# Patient Record
Sex: Female | Born: 1935 | Race: White | Hispanic: No | Marital: Married | Smoking: Never smoker
Health system: Southern US, Community
[De-identification: ages and names within clinical notes are randomized; demographics above are authoritative.]

## PROBLEM LIST (undated history)

## (undated) DIAGNOSIS — K529 Noninfective gastroenteritis and colitis, unspecified: Secondary | ICD-10-CM

## (undated) DIAGNOSIS — E119 Type 2 diabetes mellitus without complications: Secondary | ICD-10-CM

## (undated) DIAGNOSIS — K589 Irritable bowel syndrome without diarrhea: Secondary | ICD-10-CM

## (undated) DIAGNOSIS — E7800 Pure hypercholesterolemia, unspecified: Secondary | ICD-10-CM

## (undated) DIAGNOSIS — I10 Essential (primary) hypertension: Secondary | ICD-10-CM

## (undated) DIAGNOSIS — M109 Gout, unspecified: Secondary | ICD-10-CM

## (undated) DIAGNOSIS — M1990 Unspecified osteoarthritis, unspecified site: Secondary | ICD-10-CM

## (undated) HISTORY — PX: OTHER SURGICAL HISTORY: SHX169

## (undated) HISTORY — DX: Gout, unspecified: M10.9

## (undated) HISTORY — PX: ABDOMINAL HYSTERECTOMY: SHX81

## (undated) HISTORY — DX: Unspecified osteoarthritis, unspecified site: M19.90

## (undated) HISTORY — PX: APPENDECTOMY: SHX54

## (undated) HISTORY — PX: BRAIN SURGERY: SHX531

## (undated) HISTORY — PX: CHOLECYSTECTOMY: SHX55

## (undated) HISTORY — PX: BREAST BIOPSY: SHX20

---

## 1998-05-19 HISTORY — PX: REPLACEMENT TOTAL KNEE: SUR1224

## 2004-04-24 ENCOUNTER — Ambulatory Visit: Payer: Self-pay | Admitting: Unknown Physician Specialty

## 2004-06-03 ENCOUNTER — Ambulatory Visit: Payer: Self-pay | Admitting: Internal Medicine

## 2005-02-06 ENCOUNTER — Ambulatory Visit: Payer: Self-pay | Admitting: Internal Medicine

## 2005-03-14 ENCOUNTER — Ambulatory Visit: Payer: Self-pay | Admitting: Internal Medicine

## 2006-01-01 ENCOUNTER — Ambulatory Visit: Payer: Self-pay

## 2006-04-14 ENCOUNTER — Ambulatory Visit: Payer: Self-pay | Admitting: Internal Medicine

## 2006-06-11 ENCOUNTER — Ambulatory Visit: Payer: Self-pay | Admitting: Cardiology

## 2007-06-16 ENCOUNTER — Ambulatory Visit: Payer: Self-pay | Admitting: Internal Medicine

## 2007-06-17 ENCOUNTER — Ambulatory Visit: Payer: Self-pay | Admitting: Internal Medicine

## 2008-01-22 ENCOUNTER — Emergency Department: Payer: Self-pay

## 2008-06-08 ENCOUNTER — Ambulatory Visit: Payer: Self-pay | Admitting: Ophthalmology

## 2008-06-20 ENCOUNTER — Ambulatory Visit: Payer: Self-pay | Admitting: Ophthalmology

## 2008-07-13 ENCOUNTER — Ambulatory Visit: Payer: Self-pay | Admitting: Internal Medicine

## 2008-07-19 ENCOUNTER — Ambulatory Visit: Payer: Self-pay | Admitting: Ophthalmology

## 2008-08-01 ENCOUNTER — Ambulatory Visit: Payer: Self-pay | Admitting: Ophthalmology

## 2008-08-28 ENCOUNTER — Ambulatory Visit: Payer: Self-pay | Admitting: Unknown Physician Specialty

## 2008-09-12 ENCOUNTER — Ambulatory Visit: Payer: Self-pay | Admitting: Unknown Physician Specialty

## 2009-03-01 ENCOUNTER — Ambulatory Visit: Payer: Self-pay | Admitting: Unknown Physician Specialty

## 2009-07-25 ENCOUNTER — Ambulatory Visit: Payer: Self-pay | Admitting: Internal Medicine

## 2010-07-02 ENCOUNTER — Ambulatory Visit: Payer: Self-pay | Admitting: Ophthalmology

## 2010-07-29 ENCOUNTER — Ambulatory Visit: Payer: Self-pay | Admitting: Internal Medicine

## 2011-06-13 ENCOUNTER — Ambulatory Visit: Payer: Self-pay | Admitting: Unknown Physician Specialty

## 2011-07-07 ENCOUNTER — Ambulatory Visit: Payer: Self-pay | Admitting: Internal Medicine

## 2011-07-24 ENCOUNTER — Ambulatory Visit: Payer: Self-pay | Admitting: Unknown Physician Specialty

## 2011-07-24 LAB — URINALYSIS, COMPLETE
Bacteria: NONE SEEN
Blood: NEGATIVE
Glucose,UR: 150 mg/dL (ref 0–75)
Nitrite: NEGATIVE
Ph: 5 (ref 4.5–8.0)
Protein: NEGATIVE
Specific Gravity: 1.021 (ref 1.003–1.030)
Transitional Epi: <1
WBC UR: 2 /HPF (ref 0–5)

## 2011-07-24 LAB — PROTIME-INR: INR: 1

## 2011-07-24 LAB — APTT: Activated PTT: 29 secs (ref 23.6–35.9)

## 2011-07-30 ENCOUNTER — Inpatient Hospital Stay: Payer: Self-pay | Admitting: Unknown Physician Specialty

## 2011-07-30 LAB — HEMOGLOBIN: HGB: 12.1 g/dL (ref 12.0–16.0)

## 2011-07-30 LAB — CREATININE, SERUM
Creatinine: 0.77 mg/dL (ref 0.60–1.30)
EGFR (African American): >60
EGFR (Non-African Amer.): >60

## 2011-07-30 LAB — PLATELET COUNT: Platelet: 198 10*3/uL (ref 150–440)

## 2011-07-30 LAB — ELECTROLYTE PANEL: Chloride: 106 mmol/L (ref 98–107)

## 2011-07-31 LAB — BASIC METABOLIC PANEL
Calcium, Total: 7.9 mg/dL — ABNORMAL LOW (ref 8.5–10.1)
Chloride: 102 mmol/L (ref 98–107)
EGFR (Non-African Amer.): >60
Glucose: 214 mg/dL — ABNORMAL HIGH (ref 65–99)
Potassium: 3.4 mmol/L — ABNORMAL LOW (ref 3.5–5.1)
Sodium: 138 mmol/L (ref 136–145)

## 2011-08-02 LAB — CBC WITH DIFFERENTIAL/PLATELET
Basophil %: 0.3 %
Eosinophil %: 0.6 %
HCT: 27.7 % — ABNORMAL LOW (ref 35.0–47.0)
Lymphocyte #: 1.5 10*3/uL (ref 1.0–3.6)
MCH: 30 pg (ref 26.0–34.0)
MCV: 90 fL (ref 80–100)
Monocyte #: 0.7 10*3/uL (ref 0.0–0.7)
Monocyte %: 9.4 %
Neutrophil #: 5.5 10*3/uL (ref 1.4–6.5)
Neutrophil %: 70.5 %
Platelet: 192 10*3/uL (ref 150–440)
RBC: 3.08 10*6/uL — ABNORMAL LOW (ref 3.80–5.20)
RDW: 13.9 % (ref 11.5–14.5)

## 2011-11-18 ENCOUNTER — Ambulatory Visit: Payer: Self-pay | Admitting: Internal Medicine

## 2012-11-30 ENCOUNTER — Ambulatory Visit: Payer: Self-pay | Admitting: Internal Medicine

## 2013-01-31 ENCOUNTER — Ambulatory Visit: Payer: Self-pay | Admitting: Cardiology

## 2013-06-30 ENCOUNTER — Ambulatory Visit: Payer: Self-pay | Admitting: Specialist

## 2013-09-06 ENCOUNTER — Ambulatory Visit: Payer: Self-pay | Admitting: Ophthalmology

## 2013-12-26 ENCOUNTER — Ambulatory Visit: Payer: Self-pay | Admitting: Internal Medicine

## 2014-08-02 ENCOUNTER — Ambulatory Visit: Payer: Self-pay | Admitting: Unknown Physician Specialty

## 2014-09-10 NOTE — Op Note (Signed)
PATIENT NAME:  Dana Green, Dana Green MR#:  [id number] DATE OF BIRTH:  [birth year]  DATE OF PROCEDURE:  07/30/2011  PREOPERATIVE DIAGNOSIS:  Symptomatic degenerative arthritis, left knee.   POSTOPERATIVE DIAGNOSIS:   Symptomatic degenerative arthritis, left knee.   OPERATION: [employer] total knee replacement on the left.   SURGEON: Kathrene Alu., M.D.   FIRST ASSISTANT: Dorthula Matas, PA-C   ANESTHESIA: Femoral nerve block plus spinal.   HISTORY: The patient had a long history of bilateral knee pain. Her left had been more symptomatic. X-rays were consistent with rather significant degenerative arthritis of her knees. Because she had not responded to conservative treatment, she was ultimately brought in for a total knee replacement on the left.   DESCRIPTION OF PROCEDURE: The patient was taken to operating room where satisfactory femoral nerve block was achieved on the left followed by satisfactory spinal anesthesia.   A tourniquet was applied to patient's left upper thigh and the left lower extremity was prepped and draped in the usual fashion for a procedure about the knee. The patient incidentally was given 2 grams of Kefzol IV prior to the start of the procedure.   Next, the left lower extremity was exsanguinated and the tourniquet was inflated. A longitudinal incision was made over the anterior aspect of the left knee joint. Dissection was carried down through the subcutaneous tissue onto the extensor mechanism. The vastus medialis was divided in line with its fibers about a fingerbreadth above the superomedial pole of the patella. The joint was entered and dissection was carried along the medial border of the patella and medial border of the patellar tendon. As the knee was flexed, the patella was everted and dislocated laterally. Inspection of the knee joint revealed that the patient had some osteophytes about the distal femur. There was a significant central chondral lesions in her  medial and lateral femoral condyles. There was a significant degenerative indentation of the midportion of her lateral tibial plateau.   I went ahead and removed the osteophytes. Meniscal remnants were excised.   A 3/8-inch hole was made in the trochlear groove about a centimeter anterior to the posterior cruciate ligament insertion on the distal femur.  An intramedullary rod was inserted.  On the distal end of the rod was an alignment jig and cutting block. Once the alignment jig was appropriately positioned, the cutting block was fixed to the distal femur with smooth pins. Alignment jig and intramedullary rod were removed. The distal femoral cut was made. I then templated the distal femur for a #7 femoral component.  We had to make the starting holes with a #9 jig in order to keep from notching the anterior aspect of the distal femur. The starting holes were placed in slight external rotation. I then impacted the #7 cutting block onto the distal femur and made my anterior and posterior cuts along with my anterior and posterior chamfer cuts. The intercondylar area was notched out using the appropriate jig. The anterior and posterior cruciates were removed at this time.   With the knee hyperflexed, I went ahead and made a 3/8-inch hole in the midline of the proximal tibia at the junction of the anterior one third and posterior two thirds. Intramedullary rod was inserted. On the proximal end of the rod was a 0-degree sloped cutting block. We positioned the cutting block for a cut 2 mm below the lowest portion of the lateral tibial plateau. The cutting block was aligned and then fixed to the proximal tibia  with smooth pins. The intramedullary rod and stylus were removed, and then I made my proximal tibia cut without difficulty. We sized the proximal tibia for a #5 tibial baseplate.   Osteophytes were removed from the posterior aspect of the medial and lateral femoral condyles along with some residual meniscal  remnants. I used an elevator to try to free up the capsule from its attachment to the posterior aspect of the distal femur.   I checked the medial and lateral gaps with the knee extended. They seemed to be symmetric.   I went ahead and inserted the trial components. This was a #5 tibial baseplate and a #7 PS femoral component. The 10-mm polyethylene PS spacer was used. It seemed to be too thick.  I removed it and inserted an 8-mm spacer onto the tibial baseplate. This allowed for virtual full extension. The knee seemed to track well and was quite stable.   I released the tourniquet. It had been up about 79 minutes initially. I then removed about 8 mm of bone from the retropatellar surface. Three holes were made for a #5 trial retropatellar resurfacing component. With the #5 10-mm thick retropatellar resurfacing component in place the patella seemed to track well.   After the tourniquet had been down about 10 minutes the left lower extremity was re- exsanguinated and the tourniquet was inflated.   I then made the cruciate cut in the proximal tibia using the #5 tibial baseplate as a guide. Next, I used jet lavage to remove blood from the cut cancellous surface. I then sequentially implanted the total knee components. The #5 tibial baseplate was impacted onto the proximal tibia with methylmethacrylate. The #7 PS femoral component was impacted onto the distal femur with methylmethacrylate. Excess glue was removed. I then inserted an 8-mm trial polyethylene spacer. The knee was extended and then the #5 10-mm thick resurfacing patellar component was glued to the retropatellar surface.  Once again excess glue was removed.   After the glue had set up, I removed the trial 8-mm spacer and impacted the permanent #5   8-mm PS tibial-bearing insert onto the tibial baseplate.   Once again the knee moved well and seemed to be stable. Tourniquet was released. It was up about 34 minutes the second time. Bleeding was  controlled with coagulation cautery. The wound  was again irrigated with GU irrigant. Two Autovac tubes were inserted into the depths of the wound. The capsule was closed with #1 Ethibond sutures and #1 Vicryl sutures. The subcutaneous tissue was closed with 2-0 Vicryl and the skin with skin staples. Betadine was applied to the incision and the puncture wounds. Sterile dressing was applied followed by a Polar Care cooling pad and a knee immobilizer.   The patient was then transferred to her stretcher bed and taken to the recovery room in satisfactory condition. Blood loss was about 300 mL.   SUMMARY OF IMPLANTS USED: #7 PS Scorpio NRG femoral component, #5 standard tibial tray, #5 8-mm thick PS tibial-bearing insert, and a #5 10-mm thick resurfacing patellar component.   ____________________________ Kathrene Alu., MD hbk:bjt D: 07/31/2011 13:07:52 ET T: 07/31/2011 14:10:36 ET JOB#: [id number]  cc: Kathrene Alu., MD, <Dictator> Kathrene Alu MD ELECTRONICALLY SIGNED 08/10/2011 18:17

## 2014-09-10 NOTE — Discharge Summary (Signed)
PATIENT NAME:  Dana Green, Dana Green MR#:  [id number] DATE OF BIRTH:  [birth year]  DATE OF ADMISSION:  07/30/2011 DATE OF DISCHARGE:  08/03/2011  ADMITTING DIAGNOSIS: Status post left total knee replacement for degenerative arthritis.   DISCHARGE DIAGNOSES:  1. Status post left total knee replacement for degenerative arthritis.  2. Hypokalemia, improved.  3. Acute blood loss anemia.  4. Elevated blood sugars.   ATTENDING: Dr. Leanor Kail with [facility] orthopedics  PROCEDURE: On 03/13 the patient underwent left total knee arthroplasty by Dr. Jefm Bryant with assistant Dorthula Matas, PA-C.   ANESTHESIA: Spinal with femoral nerve block.   OPERATIVE FINDINGS: Tricompartmental degenerative arthritis.   IMPLANTS: By [employer].   DRAINS: Autovac drain was placed.   ESTIMATED BLOOD LOSS: 300 mL.   COMPLICATIONS: No complications had occurred.   PATIENT HISTORY: Dana Green is a 81 year old who has been medically cleared for a left knee replacement. She has had diffuse degenerative arthritis involving the left knee. She has had some right knee arthritis as well but the left knee has been more symptomatic.   ALLERGIES AND ADVERSE REACTIONS: Aspirin, propoxyphene, Fiorinal, Mevacor, Motrin, sulfa, Valium, hydrochlorothiazide and Cipro.   PAST MEDICAL HISTORY:  1. Allergies.  2. Hyperlipidemia.  3. Diabetes.  4. Gout.  5. Cataracts in both eyes. 6. Chickenpox.   PHYSICAL EXAMINATION: HEART: Normal sinus rhythm without murmur. LUNGS: Clear to auscultation. LEFT KNEE: Mild swelling. No obvious effusion. Mild valgus attitude. She had full extension with flexion to around 115 degrees. She was tender at the joint lines and McMurray's test produced diffuse discomfort. Her left knee was stable. No significant swelling in the left leg. No noted neurovascular deficit   HOSPITAL COURSE: Patient underwent aforementioned procedure on 07/30/2010 and was transferred to the postanesthesia care unit  then the orthopedic floor in stable condition. She did have some hypokalemia early on with 3.4 potassium. She would be supplemented with potassium and her potassium level would normalize on the 15th. Her hemoglobin level dropped to 9.4 on the 15th. No transfusion had been deemed necessary. Postoperative day two her Hemovac drain was pulled and dressing was changed. There was some problem with her knee draining a lot. Her Lovenox was temporarily held and then restarted. Her incision site was found to have staples intact and no sign of infection. The patient had a good hospital course. She tolerated her diet well. Again, her Foley catheter was removed on postoperative day two. She passed some stool on the 15th. She worked with physical therapy on multiple occasions while here and towards the end really did not have much pain. Active assistive range of motion of the knee was 2 to 89 degrees and patient was able to ambulate 225 feet last day she was here. Hemoglobin would drop to 9.3 on the 16th. Patient did have some elevated blood sugars as would be expected after surgery. She was discharged to her home with home health therapy on the 17th in stable condition.   DISCHARGE MEDICATIONS: Her discharge medications include the following: 1. Oxycodone 5 to 10 mg every four hours as needed for pain.  2. She may also take Tylenol.  3. Lovenox 40 mg subcutaneous injection once a day.  DISCHARGE INSTRUCTIONS AND FOLLOW UP: 1. She is weight-bearing as tolerated on the surgical leg.  2. She will wear thigh-high TED hose during the day.  3. No concentrated sweets or sugar in her diet.  4. Resume home medications with multivitamin.  5. She will keep her knee  dressing on, keep it clean and dry. It may be changed if needed though. She will keep her incision site clean and dry though. She will call our office for any disturbing symptoms.  6. She will use her Polar Care unit.  7. She will call our office to confirm  follow-up appointment.   ____________________________ Dana Green, [place] jrp:cms D: 08/06/2011 12:01:33 ET T: 08/07/2011 09:19:45 ET JOB#: [id number]  cc: Dana Ivory. Charlett Nose, [place], <Dictator> [place] PA ELECTRONICALLY SIGNED 08/07/2011 11:57

## 2014-09-11 LAB — SURGICAL PATHOLOGY

## 2014-10-08 ENCOUNTER — Encounter: Payer: Self-pay | Admitting: Emergency Medicine

## 2014-10-08 ENCOUNTER — Other Ambulatory Visit: Payer: Self-pay

## 2014-10-08 ENCOUNTER — Emergency Department: Payer: Medicare Other

## 2014-10-08 ENCOUNTER — Emergency Department
Admission: EM | Admit: 2014-10-08 | Discharge: 2014-10-08 | Disposition: A | Discharge status: Home / Self Care | Payer: Medicare Other | Attending: Emergency Medicine | Admitting: Emergency Medicine

## 2014-10-08 DIAGNOSIS — E119 Type 2 diabetes mellitus without complications: Secondary | ICD-10-CM | POA: Insufficient documentation

## 2014-10-08 DIAGNOSIS — I10 Essential (primary) hypertension: Secondary | ICD-10-CM | POA: Insufficient documentation

## 2014-10-08 DIAGNOSIS — R0789 Other chest pain: Secondary | ICD-10-CM

## 2014-10-08 DIAGNOSIS — R079 Chest pain, unspecified: Secondary | ICD-10-CM | POA: Diagnosis present

## 2014-10-08 HISTORY — DX: Essential (primary) hypertension: I10

## 2014-10-08 HISTORY — DX: Type 2 diabetes mellitus without complications: E11.9

## 2014-10-08 HISTORY — DX: Noninfective gastroenteritis and colitis, unspecified: K52.9

## 2014-10-08 HISTORY — DX: Pure hypercholesterolemia, unspecified: E78.00

## 2014-10-08 HISTORY — DX: Irritable bowel syndrome without diarrhea: K58.9

## 2014-10-08 LAB — CBC
HCT: 41.9 % (ref 35.0–47.0)
Hemoglobin: 13.6 g/dL (ref 12.0–16.0)
MCH: 28.5 pg (ref 26.0–34.0)
MCHC: 32.4 g/dL (ref 32.0–36.0)
MCV: 87.9 fL (ref 80.0–100.0)
Platelets: 189 10*3/uL (ref 150–440)
RBC: 4.77 MIL/uL (ref 3.80–5.20)
RDW: 14.7 % — ABNORMAL HIGH (ref 11.5–14.5)
WBC: 5.4 10*3/uL (ref 3.6–11.0)

## 2014-10-08 LAB — TROPONIN I: Troponin I: <0.03 ng/mL (ref <0.031)

## 2014-10-08 LAB — BASIC METABOLIC PANEL
Anion gap: 10 (ref 5–15)
BUN: 12 mg/dL (ref 6–20)
CO2: 22 mmol/L (ref 22–32)
CREATININE: 0.87 mg/dL (ref 0.44–1.00)
Calcium: 8.9 mg/dL (ref 8.9–10.3)
Chloride: 107 mmol/L (ref 101–111)
GFR, EST NON AFRICAN AMERICAN: 60 mL/min — AB (ref >60)
Glucose, Bld: 180 mg/dL — ABNORMAL HIGH (ref 65–99)
POTASSIUM: 4.3 mmol/L (ref 3.5–5.1)
Sodium: 139 mmol/L (ref 135–145)

## 2014-10-08 MED ORDER — RANITIDINE HCL 150 MG PO TABS
300.0000 mg | ORAL_TABLET | Freq: Once | ORAL | Status: AC
  Administered 2014-10-08: 300 mg via ORAL

## 2014-10-08 MED ORDER — GI COCKTAIL ~~LOC~~
ORAL | Status: AC
  Administered 2014-10-08: 30 mL via ORAL
  Filled 2014-10-08: qty 30

## 2014-10-08 MED ORDER — GI COCKTAIL ~~LOC~~
30.0000 mL | ORAL | Status: AC
  Administered 2014-10-08: 30 mL via ORAL

## 2014-10-08 MED ORDER — RANITIDINE HCL 150 MG PO TABS
ORAL_TABLET | ORAL | Status: AC
  Administered 2014-10-08: 300 mg via ORAL
  Filled 2014-10-08: qty 2

## 2014-10-08 MED ORDER — RANITIDINE HCL 150 MG PO CAPS: 150.0000 mg | ORAL_CAPSULE | Freq: Two times a day (BID) | ORAL | Status: DC

## 2014-10-08 NOTE — ED Notes (Signed)
Pt alert x4 respirations easy non labored ambulates without distress

## 2014-10-08 NOTE — ED Provider Notes (Signed)
[facility] Emergency Department Provider Note  ____________________________________________  Time seen: 8:15 AM  I have reviewed the triage vital signs and the nursing notes.   HISTORY  Chief Complaint Chest Pain    HPI Dana Green is a 81 y.o. female who complains of mid chest pain described as sharp and achy that is intermittent, lasting less than a minute, recurring multiple times over the night. Denies any previous pain. No shortness of breath, vomiting, diaphoresis. Not exertional or pleuritic. No dizziness or lightheadedness, no recent illness. Otherwise, no other symptoms. No lower extremity edema or dominant pain, nausea, vomiting or diarrhea     Past Medical History  Diagnosis Date  . Hypertension   . Hypercholesteremia   . Diabetes mellitus without complication   . Chronic diarrhea   . Spastic colon     There are no active problems to display for this patient.   Past Surgical History  Procedure Laterality Date  . Brain surgery      Mass Removal; Anuerysm Repair  . Abdominal hysterectomy    . Floating kidney repair    . Appendectomy    . Cholecystectomy      Current Outpatient Rx  Name  Route  Sig  Dispense  Refill  . ranitidine (ZANTAC) 150 MG capsule   Oral   Take 1 capsule (150 mg total) by mouth 2 (two) times daily.   28 capsule   0     Allergies Aspirin  No family history on file.  Social History History  Substance Use Topics  . Smoking status: Never Smoker   . Smokeless tobacco: Not on file  . Alcohol Use: No    Review of Systems  Constitutional: No fever or chills. No weight changes Eyes:No blurry vision or double vision.  ENT: No sore throat. Cardiovascular: Chest pain as above Respiratory: No dyspnea or cough. Gastrointestinal: Negative for abdominal pain, vomiting and diarrhea.  No BRBPR or melena. Genitourinary: Negative for dysuria, urinary retention, bloody urine, or difficulty  urinating. Musculoskeletal: Negative for back pain. No joint swelling or pain. Skin: Negative for rash. Neurological: Negative for headaches, focal weakness or numbness. Psychiatric:No anxiety or depression.   Endocrine:No hot/cold intolerance, changes in energy, or sleep difficulty.  10-point ROS otherwise negative.  ____________________________________________   PHYSICAL EXAM:  VITAL SIGNS: ED Triage Vitals  Enc Vitals Group     BP 10/08/14 0656 175/45 mmHg     Pulse Rate 10/08/14 0656 66     Resp 10/08/14 0656 20     Temp 10/08/14 0656 98.2 F (36.8 C)     Temp Source 10/08/14 0656 Oral     SpO2 10/08/14 0656 97 %     Weight 10/08/14 0643 168 lb (76.204 kg)     Height 10/08/14 0643 5\' 4"  (1.626 m)     Head Cir --      Peak Flow --      Pain Score 10/08/14 0643 8     Pain Loc --      Pain Edu? --      Excl. in [place]? --      Constitutional: Alert and oriented. Well appearing and in no distress. Eyes: No scleral icterus. No conjunctival pallor. PERRL. EOMI ENT   Head: Normocephalic and atraumatic.   Nose: No congestion/rhinnorhea. No septal hematoma   Mouth/Throat: MMM, no pharyngeal erythema. No peritonsillar mass. No uvula shift.   Neck: No stridor. No SubQ emphysema. No meningismus. Hematological/Lymphatic/Immunilogical: No cervical lymphadenopathy. Cardiovascular: RRR.  Normal and symmetric distal pulses are present in all extremities. No murmurs, rubs, or gallops. Chest wall nontender Respiratory: Normal respiratory effort without tachypnea nor retractions. Breath sounds are clear and equal bilaterally. No wheezes/rales/rhonchi. Gastrointestinal: Soft and nontender. No distention. There is no CVA tenderness.  No rebound, rigidity, or guarding. Genitourinary: deferred Musculoskeletal: Nontender with normal range of motion in all extremities. No joint effusions.  No lower extremity tenderness.  No edema. Neurologic:   Normal speech and language.  CN  2-10 normal. Motor grossly intact. No pronator drift.  Normal gait. No gross focal neurologic deficits are appreciated.  Skin:  Skin is warm, dry and intact. No rash noted.  No petechiae, purpura, or bullae. Psychiatric: Mood and affect are normal. Speech and behavior are normal. Patient exhibits appropriate insight and judgment.  ____________________________________________    LABS (pertinent positives/negatives) (all labs ordered are listed, but only abnormal results are displayed) Labs Reviewed  CBC - Abnormal; Notable for the following:    RDW 14.7 (*)    All other components within normal limits  BASIC METABOLIC PANEL - Abnormal; Notable for the following:    Glucose, Bld 180 (*)    GFR calc non Af Amer 60 (*)    All other components within normal limits  TROPONIN I  TROPONIN I   ____________________________________________   EKG  Normal sinus rhythm, left axis left bundle branch block, normal ST segments, T-wave inversions in 1 and aVL as expected for left bundle branch block.  Repeat EKG at 9:03 AM unchanged. Normal sinus rhythm, rate 64. Left axis left bundle branch block. No ischemic changes  ____________________________________________    RADIOLOGY  Unremarkable except for mild changes of bronchitis  ____________________________________________   PROCEDURES  ____________________________________________   INITIAL IMPRESSION / ASSESSMENT AND PLAN / ED COURSE  Pertinent labs & imaging results that were available during my care of the patient were reviewed by me and considered in my medical decision making (see chart for details).  Patient presents with atypical chest pain. Low suspicion of ACS, PE, pneumonia, TAD, pneumothorax, carditis mediastinitis. We'll check troponin 2 due to her risk factors, and give her a GI cocktail. Vital signs remain acceptable in the ED, except for hypertension.  ----------------------------------------- 10:35 AM on  10/08/2014 -----------------------------------------  Workup unremarkable. Pain resolved after GI cocktail and has not recurred. Chest x-ray suggests bronchitis, but she doesn't have a lot of other symptoms of this. This may be contributing to her symptoms, or it may be related to acid reflux. We'll keep her on ranitidine trial for a few weeks and have her follow up with primary care for further evaluation.  ____________________________________________   FINAL CLINICAL IMPRESSION(S) / ED DIAGNOSES  Final diagnoses:  Atypical chest pain      Carrie Mew, MD 10/08/14 1035

## 2014-10-08 NOTE — Discharge Instructions (Signed)

## 2014-10-08 NOTE — ED Notes (Signed)
Charge RN aware of need for bed. 

## 2014-10-08 NOTE — ED Notes (Signed)
Pt presents to ER alert and in NAD. Pt reports mid chest pain that started last night and radiates to left arm.

## 2015-02-23 ENCOUNTER — Other Ambulatory Visit: Payer: Self-pay | Admitting: Internal Medicine

## 2015-02-23 DIAGNOSIS — Z1231 Encounter for screening mammogram for malignant neoplasm of breast: Secondary | ICD-10-CM

## 2015-03-02 ENCOUNTER — Ambulatory Visit
Admission: RE | Admit: 2015-03-02 | Discharge: 2015-03-02 | Disposition: A | Discharge status: Home / Self Care | Payer: Medicare Other | Source: Ambulatory Visit | Attending: Internal Medicine | Admitting: Internal Medicine

## 2015-03-02 ENCOUNTER — Other Ambulatory Visit: Payer: Self-pay | Admitting: Internal Medicine

## 2015-03-02 DIAGNOSIS — Z1231 Encounter for screening mammogram for malignant neoplasm of breast: Secondary | ICD-10-CM | POA: Diagnosis present

## 2015-04-09 DIAGNOSIS — I447 Left bundle-branch block, unspecified: Secondary | ICD-10-CM | POA: Insufficient documentation

## 2015-05-03 ENCOUNTER — Other Ambulatory Visit
Admission: RE | Admit: 2015-05-03 | Discharge: 2015-05-03 | Disposition: A | Discharge status: Home / Self Care | Payer: Medicare Other | Source: Ambulatory Visit | Attending: Nurse Practitioner | Admitting: Nurse Practitioner

## 2015-05-03 DIAGNOSIS — R197 Diarrhea, unspecified: Secondary | ICD-10-CM | POA: Insufficient documentation

## 2015-05-03 LAB — C DIFFICILE QUICK SCREEN W PCR REFLEX
C Diff antigen: NEGATIVE
C Diff interpretation: NEGATIVE
C Diff toxin: NEGATIVE

## 2016-04-28 ENCOUNTER — Encounter: Payer: Self-pay | Admitting: Urology

## 2016-04-28 ENCOUNTER — Ambulatory Visit (INDEPENDENT_AMBULATORY_CARE_PROVIDER_SITE_OTHER): Payer: Medicare Other | Admitting: Urology

## 2016-04-28 VITALS — BP 189/67 | HR 76 | Ht 64.0 in | Wt 167.5 lb

## 2016-04-28 DIAGNOSIS — N3946 Mixed incontinence: Secondary | ICD-10-CM

## 2016-04-28 DIAGNOSIS — N390 Urinary tract infection, site not specified: Secondary | ICD-10-CM

## 2016-04-28 DIAGNOSIS — R102 Pelvic and perineal pain: Secondary | ICD-10-CM | POA: Diagnosis not present

## 2016-04-28 DIAGNOSIS — R351 Nocturia: Secondary | ICD-10-CM | POA: Diagnosis not present

## 2016-04-28 DIAGNOSIS — N952 Postmenopausal atrophic vaginitis: Secondary | ICD-10-CM

## 2016-04-28 LAB — BLADDER SCAN AMB NON-IMAGING: Scan Result: 0

## 2016-04-28 MED ORDER — OXYBUTYNIN CHLORIDE ER 15 MG PO TB24: 15.0000 mg | ORAL_TABLET | Freq: Every day | ORAL | 3 refills | Status: DC

## 2016-04-28 NOTE — Progress Notes (Signed)
04/28/2016 3:33 PM   Dana Green 01-08-1930 [id number]  Referring provider: Tracie Harrier, MD [address] [facility] [place], [place] [address]  Chief Complaint  Patient presents with  . New Patient (Initial Visit)    recurrent uti referred by Dr. Ginette Pitman    HPI: Patient is a 81 year old Caucasian female who is referred to [place] by, Hande, for urinary incontinence and recurrent UTI's.    Patient states that she has had urinary incontinence for several months, but it has worsened over the last two months.  Patient has incontinence with straining.   She is experiencing 2 to 3 incontinent episodes during the day. She is experiencing 4 to 5 incontinent episodes during the night.  Her incontinence volume is large.   She is wearing three to five pads/depends daily.    She is not having associated urinary frequency, urgency, nocturia x 4-5 and hesitancy.     She does have a history of urinary tract infections, but not history of STI's or injury to the bladder.     She admits to diarrhea.     She has not had any recent imaging studies.    She is drinking 32 oz of water daily.   She is drinking 2 to 3 caffeinated beverages daily (sweat tea and diet Pepsi).  She is not drinking alcoholic beverages.   Her risk factors for incontinence are obesity, a family history of incontinence, age, caffeine, diabetes, vaginal atrophy and pelvic surgery.   She is taking benzo's and OAB medication.  Patient states that she has had several urinary tract infections over the last year.  Her symptoms with a urinary tract infection consist of suprapubic pain, .  She denies dysuria, gross hematuria and back pain.  She has not had any recent fevers, chills, nausea or vomiting.     She does not have a history of nephrolithiasis or GU trauma.   She had surgery for a "floating kidney" in 1952.    Reviewing her records,  she has had no documented positive urine cultures.    She  is sexually active.  She has not noted a correlation with her urinary tract infections and sexual intercourse.    She is post menopausal.   She does engage in good perineal hygiene. She does not take tub baths.   She is currently on Ditropan XL 10 mg.  Her PVR is 0 mL.     PMH: Past Medical History:  Diagnosis Date  . Arthritis   . Chronic diarrhea   . Diabetes mellitus without complication ([place])   . Gout   . Hypercholesteremia   . Hypertension   . Spastic colon     Surgical History: Past Surgical History:  Procedure Laterality Date  . ABDOMINAL HYSTERECTOMY    . APPENDECTOMY    . BRAIN SURGERY     Mass Removal; Anuerysm Repair  . BREAST BIOPSY Left    neg  . CHOLECYSTECTOMY    . Floating Kidney Repair    . REPLACEMENT TOTAL KNEE Left 2000    Home Medications:    Medication List       Accurate as of 04/28/16  3:33 PM. Always use your most recent med list.          allopurinol 100 MG tablet Commonly known as:  ZYLOPRIM Take by mouth.   ALPRAZolam 0.25 MG tablet Commonly known as:  XANAX Take by mouth.   amLODipine 2.5 MG tablet Commonly known as:  NORVASC Take 5 mg by mouth.   colchicine 0.6 MG tablet Take by mouth.   colestipol 1 g tablet Commonly known as:  COLESTID Take by mouth.   dapagliflozin propanediol 5 MG Tabs tablet Commonly known as:  FARXIGA Take by mouth.   fluticasone 50 MCG/ACT nasal spray Commonly known as:  FLONASE Place into the nose.   glipiZIDE 10 MG tablet Commonly known as:  GLUCOTROL TAKE ONE TABLET BY MOUTH ONCE DAILY   loratadine 10 MG tablet Commonly known as:  CLARITIN Take by mouth.   lovastatin 20 MG tablet Commonly known as:  MEVACOR TAKE TWO TABLETS BY MOUTH ONCE DAILY   metFORMIN 1000 MG tablet Commonly known as:  GLUCOPHAGE TAKE ONE TABLET BY MOUTH TWICE DAILY   metoprolol succinate 50 MG 24 hr tablet Commonly known as:  TOPROL-XL Take by mouth.   OCUVITE ADULT FORMULA Caps Take by mouth.     omeprazole 20 MG capsule Commonly known as:  PRILOSEC Take one daily   oxybutynin 15 MG 24 hr tablet Commonly known as:  DITROPAN XL Take 1 tablet (15 mg total) by mouth daily.   ranitidine 150 MG capsule Commonly known as:  ZANTAC Take 1 capsule (150 mg total) by mouth 2 (two) times daily.   sitaGLIPtin 50 MG tablet Commonly known as:  JANUVIA Take 50 mg by mouth daily.       Allergies:  Allergies  Allergen Reactions  . Aspirin Other (See Comments)    Pt states she has metal clips and was told not to take asa  . Butalbital-Apap-Caffeine Other (See Comments)  . Ciprofloxacin Other (See Comments)  . Diazepam Other (See Comments)  . Ibuprofen Other (See Comments)  . Lovastatin     Other reaction(s): Unknown  . Other     Other reaction(s): Unknown  . Propoxyphene Other (See Comments)  . Statins     Other reaction(s): Muscle Pain  . Sulfa Antibiotics Other (See Comments)  . Tapentadol Diarrhea    Family History: Family History  Problem Relation Age of Onset  . Prostate cancer Father   . Bladder Cancer Father   . Prostate cancer Brother   . Kidney disease Neg Hx     Social History:  reports that she has never smoked. She has never used smokeless tobacco. She reports that she does not drink alcohol or use drugs.  ROS: UROLOGY Frequent Urination?: Yes Hard to postpone urination?: Yes Burning/pain with urination?: No Get up at night to urinate?: Yes Leakage of urine?: Yes Urine stream starts and stops?: No Trouble starting stream?: Yes Do you have to strain to urinate?: No Blood in urine?: No Urinary tract infection?: Yes Sexually transmitted disease?: No Injury to kidneys or bladder?: No Painful intercourse?: No Weak stream?: No Currently pregnant?: No Vaginal bleeding?: No Last menstrual period?: n  Gastrointestinal Nausea?: Yes Vomiting?: Yes Indigestion/heartburn?: No Diarrhea?: Yes Constipation?: No  Constitutional Fever: No Night sweats?:  Yes Weight loss?: No Fatigue?: No  Skin Skin rash/lesions?: No Itching?: No  Eyes Blurred vision?: No Double vision?: No  Ears/Nose/Throat Sore throat?: No Sinus problems?: Yes  Hematologic/Lymphatic Swollen glands?: No Easy bruising?: Yes  Cardiovascular Leg swelling?: Yes Chest pain?: No  Respiratory Cough?: No Shortness of breath?: No  Endocrine Excessive thirst?: No  Musculoskeletal Back pain?: Yes Joint pain?: Yes  Neurological Headaches?: No Dizziness?: No  Psychologic Depression?: No Anxiety?: No  Physical Exam: BP (!) 189/67   Pulse 76   Ht 5\' 4"  (1.626 m)  Wt 167 lb 8 oz (76 kg)   BMI 28.75 kg/m   Constitutional: Well nourished. Alert and oriented, No acute distress. HEENT: [place] AT, moist mucus membranes. Trachea midline, no masses. Cardiovascular: No clubbing, cyanosis, or edema. Respiratory: Normal respiratory effort, no increased work of breathing. GI: Abdomen is soft, non tender, non distended, no abdominal masses. Liver and spleen not palpable.  No hernias appreciated.  Stool sample for occult testing is not indicated.   GU: No CVA tenderness.  No bladder fullness or masses.  Atrophic external genitalia, normal pubic hair distribution, no lesions.  Normal urethral meatus, no lesions, no prolapse, no discharge.   Urethral caruncle.  No bladder fullness, tenderness or masses.   Pale vagina mucosa, poor estrogen effect, no discharge, no lesions, good pelvic support, Grade II cystocele is noted.  Rectocele is noted.  Cervix and uterus are surgically absent.  No pelvic masses.  Anus and perineum are without rashes or lesions.    Skin: No rashes, bruises or suspicious lesions. Lymph: No cervical or inguinal adenopathy. Neurologic: Grossly intact, no focal deficits, moving all 4 extremities. Psychiatric: Normal mood and affect.  Laboratory Data: Lab Results  Component Value Date   WBC 5.4 10/08/2014   HGB 13.6 10/08/2014   HCT 41.9 10/08/2014     MCV 87.9 10/08/2014   PLT 189 10/08/2014    Lab Results  Component Value Date   CREATININE 0.87 10/08/2014    Pertinent Imaging: Results for ANNICE, NIBBE (MRN [id number]) as of 04/28/2016 15:11  Ref. Range 04/28/2016 14:46  Scan Result Unknown 0    Assessment & Plan:    1. Suprapubic pain  - Patient is instructed to increase her water intake until the urine is pale yellow or clear.  I have advised her to take probiotics (yogurt, oral pills or vaginal suppositories), take cranberry pills or drink the juice and use the estrogen cream.  She is to take Vitamin C 1,000 mg daily to acidify the urine. She should also avoid soaking in tubs and wipe front to back after urinating.  She may benefit from core strengthening exercises.  We can refer her to PT if she desires.    - Because of her history of recurrent UTIs, I have asked the patient to contact our office if she should experience symptoms of urinary tract infection so that we can CATH her for an urine specimen for urinalysis and culture. This is to prevent a skin contaminant from showing up in the urine culture.  If she should have her symptoms after hours or cannot get to our office, she should notify her other providers that she needs a catheterized specimen for UA and culture.   - I reviewed the symptoms of a urinary tract infection, such as a worsening of urinary urgency and frequency, dysuria, which is painful urination and not the pain of urine hitting sensitive perineal skin, hematuria, foul-smelling urine, suprapubic pain or mental status changes. Fevers, chills, nausea and or vomiting can also be signs of a possible UTI.  Positive urinalyses and positive urine cultures that are not associated with urinary symptoms should not be treated with antibiotics.    - I explained to the patient that being exposed to unnecessary antibiotics can put her at risk for increasing resistance of the bacteria to antibiotics, C. difficile and the  side effects of the antibiotics.  2. Nocturia  - I explained to the patient that nocturia is often multi-factorial and difficult to treat.  Sleeping disorders, heart conditions, peripheral vascular disease, diabetes, an enlarged prostate for men, an urethral stricture causing bladder outlet obstruction and/or certain medications can contribute to nocturia.  - I have suggested that the patient avoid caffeine after noon and alcohol in the evening.  He or she may also benefit from fluid restrictions after 6:00 in the evening and voiding just prior to bedtime.  - I have explained that research studies have showed that over 84% of patients with sleep apnea reported frequent nighttime urination.   With sleep apnea, oxygen decreases, carbon dioxide increases, the blood become more acidic, the heart rate drops and blood vessels in the lung constrict.  The body is then alerted that something is very wrong. The sleeper must wake enough to reopen the airway. By this time, the heart is racing and experiences a false signal of fluid overload. The heart excretes a hormone-like protein that tells the body to get rid of sodium and water, resulting in nocturia.  -  I also informed the patient that a recent study noted that decreasing sodium intake to 2.3 grams daily, if they don't have issues with hyponatremia, can also reduce the number of nightly voids  - The patient may benefit from a discussion with his or her primary care physician to see if he or she has risk factors for sleep apnea or other sleep disturbances and obtaining a sleep study.  3. Vaginal atrophy  - I explained to the patient that when women go through menopause and her estrogen levels are severely diminished, the normal vaginal flora will change.  This is due to an increase of the vaginal canal's pH. Because of this, the vaginal canal may be colonized by bacteria from the rectum instead of the protective lactobacillus.   This accompanied by the loss of the mucus barrier with vaginal atrophy is a cause of recurrent urinary tract infections.  - In some studies, the use of vaginal estrogen cream has been demonstrated to reduce  recurrent urinary tract infections to one a year.   - Patient was given a sample of vaginal estrogen cream (Premarin/Estrace) and instructed to apply 0.5mg  (pea-sized amount)  just inside the vaginal introitus with a finger-tip every night for two weeks and then Monday, Wednesday and Friday nights.  I explained to the patient that vaginally administered estrogen, which causes only a slight increase in the blood estrogen levels, have fewer contraindications and adverse systemic effects that oral HT.  - I have also given prescriptions for the Estrace cream and Premarin cream, so that the patient may carry them to the pharmacy to see which one of the branded creams would be most economical for her.  If she finds both medications cost prohibitive, she is instructed to call the office.  We can then call in a compounded vaginal estrogen cream for the patient that may be more affordable.    - She will follow up in one month for an exam.    4. Mixed incontinence  - patient not a candidate for Myrbetriq due to uncontrolled HTN  - increase oxybutynin XL to 15 mg daily  - stop sweet tea and diet drinks  - RTC in one month for OAB questionnaire and PVR   Return in about 1 month (around 05/29/2016) for PVR, exam and OAB questionnaire.  These notes generated with voice recognition software. I apologize for typographical errors.  Zara Council, [facility] [address] [place], [place] [address] [phone]

## 2016-05-29 ENCOUNTER — Encounter: Payer: Self-pay | Admitting: Urology

## 2016-05-29 ENCOUNTER — Ambulatory Visit (INDEPENDENT_AMBULATORY_CARE_PROVIDER_SITE_OTHER): Payer: Medicare Other | Admitting: Urology

## 2016-05-29 VITALS — BP 152/68 | HR 65 | Ht 64.0 in | Wt 169.5 lb

## 2016-05-29 DIAGNOSIS — N952 Postmenopausal atrophic vaginitis: Secondary | ICD-10-CM

## 2016-05-29 DIAGNOSIS — R351 Nocturia: Secondary | ICD-10-CM

## 2016-05-29 DIAGNOSIS — N3946 Mixed incontinence: Secondary | ICD-10-CM | POA: Diagnosis not present

## 2016-05-29 LAB — BLADDER SCAN AMB NON-IMAGING: SCAN RESULT: 12

## 2016-05-29 MED ORDER — ESTROGENS, CONJUGATED 0.625 MG/GM VA CREA: 1.0000 | TOPICAL_CREAM | Freq: Every day | VAGINAL | 12 refills | Status: DC

## 2016-05-29 MED ORDER — OXYBUTYNIN CHLORIDE ER 15 MG PO TB24: 15.0000 mg | ORAL_TABLET | Freq: Every day | ORAL | 3 refills | Status: DC

## 2016-05-29 MED ORDER — ESTRADIOL 0.1 MG/GM VA CREA: TOPICAL_CREAM | VAGINAL | 12 refills | Status: DC

## 2016-05-29 NOTE — Progress Notes (Signed)
05/29/2016 10:40 AM   Dana Green 20-Aug-1929 [id number]  Referring provider: Tracie Harrier, MD [address] [facility] [place], [place] [address]  Chief Complaint  Patient presents with  . Nocturia    follow up 1 month   . Vaginal Atrophy    HPI: Patient is a 83 year old Caucasian female who presents today for a one month follow up for incontinence, vaginal atrophy, suprapubic pain and nocturia.  Background history Patient was referred to [place] by, Hande, for urinary incontinence and recurrent UTI's.  Patient states that she has had urinary incontinence for several months, but it has worsened over the last two months.  Patient has incontinence with straining.   She is experiencing 2 to 3 incontinent episodes during the day. She is experiencing 4 to 5 incontinent episodes during the night.  Her incontinence volume is large.   She is wearing three to five pads/depends daily.  She is not having associated urinary frequency, urgency, nocturia x 4-5 and hesitancy.   She does have a history of urinary tract infections, but not history of STI's or injury to the bladder.   She admits to diarrhea.   She has not had any recent imaging studies.  She is drinking 32 oz of water daily.   She is drinking 2 to 3 caffeinated beverages daily (sweat tea and diet Pepsi).  She is not drinking alcoholic beverages.  Her risk factors for incontinence are obesity, a family history of incontinence, age, caffeine, diabetes, vaginal atrophy and pelvic surgery.  She is taking benzo's and OAB medication.  Patient states that she has had several urinary tract infections over the last year.  Her symptoms with a urinary tract infection consist of suprapubic pain,   She denies dysuria, gross hematuria and back pain.  She has not had any recent fevers, chills, nausea or vomiting.   She does not have a history of nephrolithiasis or GU trauma.   She had surgery for a "floating kidney" in 1952.  Reviewing  her records,  she has had no documented positive urine cultures.  She is sexually active.  She has not noted a correlation with her urinary tract infections and sexual intercourse.  She is post menopausal.  She does engage in good perineal hygiene. She does not take tub baths.  She is currently on Ditropan XL 10 mg.  Her PVR is 0 mL.   At her last visit, her Ditropan XL was increased from 10 mg to 15 mg daily.   I asked her to discuss her risk factors for sleep apnea with Dr. Ginette Pitman.  She was started on vaginal estrogen cream.    Today, she is complaining of rushing to the bathroom three times daily, making 7 trips to the restroom to urinate, she is not restricting fluids, she knows where the bathroom is where ever she goes and had 3 episodes of incontinence daily.  Her PVR is 12 mL.  She states the increase of oxybutynin XL 15 mg daily has helped.   She is no longer getting up at night.    PMH: Past Medical History:  Diagnosis Date  . Arthritis   . Chronic diarrhea   . Diabetes mellitus without complication ([place])   . Gout   . Hypercholesteremia   . Hypertension   . Spastic colon     Surgical History: Past Surgical History:  Procedure Laterality Date  . ABDOMINAL HYSTERECTOMY    . APPENDECTOMY    . BRAIN SURGERY  Mass Removal; Anuerysm Repair  . BREAST BIOPSY Left    neg  . CHOLECYSTECTOMY    . Floating Kidney Repair    . REPLACEMENT TOTAL KNEE Left 2000    Home Medications:  Allergies as of 05/29/2016      Reactions   Aspirin Other (See Comments)   Pt states she has metal clips and was told not to take asa   Butalbital-apap-caffeine Other (See Comments)   Ciprofloxacin Other (See Comments)   Diazepam Other (See Comments)   Ibuprofen Other (See Comments)   Lovastatin Other (See Comments)   Other reaction(s): Unknown   Other    Other reaction(s): Unknown   Propoxyphene Other (See Comments)   Statins    Other reaction(s): Muscle Pain Other reaction(s): Muscle Pain    Sulfa Antibiotics Other (See Comments)   Tapentadol Diarrhea      Medication List       Accurate as of 05/29/16 10:40 AM. Always use your most recent med list.          allopurinol 100 MG tablet Commonly known as:  ZYLOPRIM Take by mouth.   ALPRAZolam 0.25 MG tablet Commonly known as:  XANAX Take by mouth.   amLODipine 2.5 MG tablet Commonly known as:  NORVASC Take 5 mg by mouth.   colchicine 0.6 MG tablet Take by mouth.   colestipol 1 g tablet Commonly known as:  COLESTID Take by mouth.   dapagliflozin propanediol 5 MG Tabs tablet Commonly known as:  FARXIGA Take by mouth.   estradiol 0.1 MG/GM vaginal cream Commonly known as:  ESTRACE Place 1 Applicatorful vaginally at bedtime.   fluticasone 50 MCG/ACT nasal spray Commonly known as:  FLONASE Place into the nose.   glipiZIDE 10 MG tablet Commonly known as:  GLUCOTROL TAKE ONE TABLET BY MOUTH ONCE DAILY   loratadine 10 MG tablet Commonly known as:  CLARITIN Take by mouth.   lovastatin 20 MG tablet Commonly known as:  MEVACOR TAKE TWO TABLETS BY MOUTH ONCE DAILY   metFORMIN 1000 MG tablet Commonly known as:  GLUCOPHAGE TAKE ONE TABLET BY MOUTH TWICE DAILY   metoprolol succinate 50 MG 24 hr tablet Commonly known as:  TOPROL-XL Take by mouth.   OCUVITE ADULT FORMULA Caps Take by mouth.   omeprazole 20 MG capsule Commonly known as:  PRILOSEC Take one daily   oxybutynin 15 MG 24 hr tablet Commonly known as:  DITROPAN XL Take 1 tablet (15 mg total) by mouth daily.   ranitidine 150 MG capsule Commonly known as:  ZANTAC Take 1 capsule (150 mg total) by mouth 2 (two) times daily.   sitaGLIPtin 50 MG tablet Commonly known as:  JANUVIA Take 50 mg by mouth daily.       Allergies:  Allergies  Allergen Reactions  . Aspirin Other (See Comments)    Pt states she has metal clips and was told not to take asa  . Butalbital-Apap-Caffeine Other (See Comments)  . Ciprofloxacin Other (See Comments)    . Diazepam Other (See Comments)  . Ibuprofen Other (See Comments)  . Lovastatin Other (See Comments)    Other reaction(s): Unknown  . Other     Other reaction(s): Unknown  . Propoxyphene Other (See Comments)  . Statins     Other reaction(s): Muscle Pain Other reaction(s): Muscle Pain  . Sulfa Antibiotics Other (See Comments)  . Tapentadol Diarrhea    Family History: Family History  Problem Relation Age of Onset  . Prostate cancer Father   .  Bladder Cancer Father   . Prostate cancer Brother   . Kidney disease Neg Hx     Social History:  reports that she has never smoked. She has never used smokeless tobacco. She reports that she does not drink alcohol or use drugs.  ROS: UROLOGY Frequent Urination?: No Hard to postpone urination?: No Burning/pain with urination?: No Get up at night to urinate?: No Leakage of urine?: No Urine stream starts and stops?: No Trouble starting stream?: No Do you have to strain to urinate?: No Blood in urine?: No Urinary tract infection?: No Sexually transmitted disease?: No Injury to kidneys or bladder?: No Painful intercourse?: No Weak stream?: No Currently pregnant?: No Vaginal bleeding?: No Last menstrual period?: n  Gastrointestinal Nausea?: No Vomiting?: No Indigestion/heartburn?: No Diarrhea?: No Constipation?: No  Constitutional Fever: No Night sweats?: No Weight loss?: No Fatigue?: No  Skin Skin rash/lesions?: No Itching?: No  Eyes Blurred vision?: No Double vision?: No  Ears/Nose/Throat Sore throat?: No Sinus problems?: No  Hematologic/Lymphatic Swollen glands?: No Easy bruising?: No  Cardiovascular Leg swelling?: No Chest pain?: No  Respiratory Cough?: No Shortness of breath?: No  Endocrine Excessive thirst?: No  Musculoskeletal Back pain?: No Joint pain?: No  Neurological Headaches?: No Dizziness?: No  Psychologic Depression?: No Anxiety?: No  Physical Exam: BP (!) 152/68   Pulse  65   Ht 5\' 4"  (1.626 m)   Wt 169 lb 8 oz (76.9 kg)   BMI 29.09 kg/m   Constitutional: Well nourished. Alert and oriented, No acute distress. HEENT: [place] AT, moist mucus membranes. Trachea midline, no masses. Cardiovascular: No clubbing, cyanosis, or edema. Respiratory: Normal respiratory effort, no increased work of breathing. Skin: No rashes, bruises or suspicious lesions. Lymph: No cervical or inguinal adenopathy. Neurologic: Grossly intact, no focal deficits, moving all 4 extremities. Psychiatric: Normal mood and affect.  Laboratory Data: Lab Results  Component Value Date   WBC 5.4 10/08/2014   HGB 13.6 10/08/2014   HCT 41.9 10/08/2014   MCV 87.9 10/08/2014   PLT 189 10/08/2014    Lab Results  Component Value Date   CREATININE 0.87 10/08/2014    Pertinent Imaging: Results for CINCERE, HEATH (MRN [id number]) as of 04/28/2016 15:11  Ref. Range 04/28/2016 14:46  Scan Result Unknown 0    Assessment & Plan:    1. Suprapubic pain  - resolved                    2. Nocturia  - she states that she is no longer getting up at night  - continue the Ditropan XL 15 mg daily; refills given  3. Vaginal atrophy  - using vaginal estrogen cream three nights weekly; refills given  - RTC in one year for exam  4. Mixed incontinence  - resolved  - continue oxybutynin XL 15 mg daily  - stop sweet tea and diet drinks  - RTC in one month for OAB questionnaire and PVR   Return in about 1 year (around 05/29/2017) for PVR, exam and OAB questionnaire.  These notes generated with voice recognition software. I apologize for typographical errors.  Zara Council, [facility] [address] [place], [place] [address] [phone]

## 2016-10-31 ENCOUNTER — Ambulatory Visit (INDEPENDENT_AMBULATORY_CARE_PROVIDER_SITE_OTHER): Payer: Self-pay | Admitting: Vascular Surgery

## 2016-11-11 ENCOUNTER — Encounter (INDEPENDENT_AMBULATORY_CARE_PROVIDER_SITE_OTHER): Payer: Self-pay | Admitting: Vascular Surgery

## 2016-11-11 ENCOUNTER — Ambulatory Visit (INDEPENDENT_AMBULATORY_CARE_PROVIDER_SITE_OTHER): Payer: Medicare Other | Admitting: Vascular Surgery

## 2016-11-11 VITALS — BP 151/67 | HR 73 | Resp 16 | Wt 165.8 lb

## 2016-11-11 DIAGNOSIS — M7989 Other specified soft tissue disorders: Secondary | ICD-10-CM

## 2016-11-11 DIAGNOSIS — I10 Essential (primary) hypertension: Secondary | ICD-10-CM | POA: Diagnosis not present

## 2016-11-11 DIAGNOSIS — E119 Type 2 diabetes mellitus without complications: Secondary | ICD-10-CM | POA: Insufficient documentation

## 2016-11-11 DIAGNOSIS — E118 Type 2 diabetes mellitus with unspecified complications: Secondary | ICD-10-CM

## 2016-11-11 NOTE — Assessment & Plan Note (Signed)
blood pressure control important in reducing the progression of atherosclerotic disease. On appropriate oral medications.  

## 2016-11-11 NOTE — Assessment & Plan Note (Signed)
At current, her swelling is actually quite well-controlled despite not using her compression stockings. She can use her compression stockings as needed at this point. She should continue to remain active and elevate her legs. I have offered her an annual follow-up or to call us if the symptoms worsen and she would prefer the latter. I will see her back as needed.

## 2016-11-11 NOTE — Progress Notes (Signed)
MRN : [id number]  Dana Green is a 83 y.o. (09-Nov-1929) female who presents with chief complaint of  Chief Complaint  Patient presents with  . Follow-up  .  History of Present Illness: Patient returns today in follow up of Leg swelling and discoloration. She has not been able to wear her compression stockings for about 3 months now secondary to a recent issue with hand pain and swelling which turned out to be rheumatoid arthritis. This has improved as well when she started getting treatment for that earlier this month. Despite not wearing her stockings, her lower extremity swelling at this point her legs are not currently painful. She has no ulceration or infection. She denies fever or chills.  Current Outpatient Prescriptions  Medication Sig Dispense Refill  . allopurinol (ZYLOPRIM) 100 MG tablet Take by mouth.    Marland Kitchen amLODipine (NORVASC) 2.5 MG tablet Take 5 mg by mouth.    . colchicine 0.6 MG tablet Take by mouth.    . colestipol (COLESTID) 1 g tablet Take by mouth.    Marland Kitchen glipiZIDE (GLUCOTROL) 10 MG tablet TAKE ONE TABLET BY MOUTH ONCE DAILY    . loratadine (CLARITIN) 10 MG tablet Take by mouth.    . metFORMIN (GLUCOPHAGE) 1000 MG tablet TAKE ONE TABLET BY MOUTH TWICE DAILY    . metoprolol succinate (TOPROL-XL) 50 MG 24 hr tablet Take by mouth.    . Multiple Vitamins-Minerals (OCUVITE ADULT FORMULA) CAPS Take by mouth.    Marland Kitchen omeprazole (PRILOSEC) 20 MG capsule Take one daily    . oxybutynin (DITROPAN XL) 15 MG 24 hr tablet Take 1 tablet (15 mg total) by mouth daily. 90 tablet 3  . sitaGLIPtin (JANUVIA) 50 MG tablet Take 50 mg by mouth daily.    Marland Kitchen ALPRAZolam (XANAX) 0.25 MG tablet Take by mouth.    . conjugated estrogens (PREMARIN) vaginal cream Place 1 Applicatorful vaginally daily. Apply 0.5mg  (pea-sized amount)  just inside the vaginal introitus with a finger-tip every night for two weeks and then Monday, Wednesday and Friday nights. (Patient not taking: Reported on 11/11/2016) 30  g 12  . dapagliflozin propanediol (FARXIGA) 5 MG TABS tablet Take by mouth.    . estradiol (ESTRACE VAGINAL) 0.1 MG/GM vaginal cream Apply 0.5mg  (pea-sized amount)  just inside the vaginal introitus with a finger-tip every night for two weeks and then Monday, Wednesday and Friday nights. (Patient not taking: Reported on 11/11/2016) 30 g 12  . estradiol (ESTRACE) 0.1 MG/GM vaginal cream Place 1 Applicatorful vaginally at bedtime.    . fluticasone (FLONASE) 50 MCG/ACT nasal spray Place into the nose.    . lovastatin (MEVACOR) 20 MG tablet TAKE TWO TABLETS BY MOUTH ONCE DAILY    . ranitidine (ZANTAC) 150 MG capsule Take 1 capsule (150 mg total) by mouth 2 (two) times daily. (Patient not taking: Reported on 05/29/2016) 28 capsule 0   No current facility-administered medications for this visit.     Past Medical History:  Diagnosis Date  . Arthritis   . Chronic diarrhea   . Diabetes mellitus without complication ([place])   . Gout   . Hypercholesteremia   . Hypertension   . Spastic colon     Past Surgical History:  Procedure Laterality Date  . ABDOMINAL HYSTERECTOMY    . APPENDECTOMY    . BRAIN SURGERY     Mass Removal; Anuerysm Repair  . BREAST BIOPSY Left    neg  . CHOLECYSTECTOMY    . Floating Kidney Repair    .  REPLACEMENT TOTAL Green Left 2000    Social History Social History  Substance Use Topics  . Smoking status: Never Smoker  . Smokeless tobacco: Never Used  . Alcohol use No     Family History Family History  Problem Relation Age of Onset  . Prostate cancer Father   . Bladder Cancer Father   . Prostate cancer Brother   . Kidney disease Neg Hx     Allergies  Allergen Reactions  . Aspirin Other (See Comments)    Pt states she has metal clips and was told not to take asa  . Butalbital-Apap-Caffeine Other (See Comments)  . Ciprofloxacin Other (See Comments)  . Diazepam Other (See Comments)  . Ibuprofen Other (See Comments)  . Lovastatin Other (See Comments)     Other reaction(s): Unknown  . Other     Other reaction(s): Unknown  . Propoxyphene Other (See Comments)  . Statins     Other reaction(s): Muscle Pain Other reaction(s): Muscle Pain  . Sulfa Antibiotics Other (See Comments)  . Tapentadol Diarrhea     REVIEW OF SYSTEMS (Negative unless checked)  Constitutional: [] Weight loss  [] Fever  [] Chills Cardiac: [] Chest pain   [] Chest pressure   [] Palpitations   [] Shortness of breath when laying flat   [] Shortness of breath at rest   [] Shortness of breath with exertion. Vascular:  [] Pain in legs with walking   [] Pain in legs at rest   [] Pain in legs when laying flat   [] Claudication   [] Pain in feet when walking  [] Pain in feet at rest  [] Pain in feet when laying flat   [] History of DVT   [] Phlebitis   [x] Swelling in legs   [] Varicose veins   [] Non-healing ulcers Pulmonary:   [] Uses home oxygen   [] Productive cough   [] Hemoptysis   [] Wheeze  [] COPD   [] Asthma Neurologic:  [] Dizziness  [] Blackouts   [] Seizures   [] History of stroke   [] History of TIA  [] Aphasia   [] Temporary blindness   [] Dysphagia   [] Weakness or numbness in arms   [] Weakness or numbness in legs Musculoskeletal:  [x] Arthritis   [] Joint swelling   [] Joint pain   [] Low back pain Hematologic:  [] Easy bruising  [] Easy bleeding   [] Hypercoagulable state   [] Anemic   Gastrointestinal:  [] Blood in stool   [] Vomiting blood  [] Gastroesophageal reflux/heartburn   [] Abdominal pain Genitourinary:  [] Chronic kidney disease   [] Difficult urination  [] Frequent urination  [] Burning with urination   [] Hematuria Skin:  [] Rashes   [] Ulcers   [] Wounds Psychological:  [] History of anxiety   []  History of major depression.  Physical Examination  BP (!) 151/67   Pulse 73   Resp 16   Wt 165 lb 12.8 oz (75.2 kg)   BMI 28.46 kg/m  Gen:  WD/WN, NAD. Appears younger than stated age Head: [place]/AT, No temporalis wasting. Ear/Nose/Throat: Hearing grossly intact, nares w/o erythema or drainage, trachea  midline Eyes: Conjunctiva clear. Sclera non-icteric Neck: Supple.  No JVD.  Pulmonary:  Good air movement, no use of accessory muscles.  Cardiac: RRR, normal S1, S2 Vascular:  Vessel Right Left  Radial Palpable Palpable                                   Musculoskeletal: M/S 5/5 throughout.  No deformity or atrophy. Trace bilateral lower extremity edema. Neurologic: Sensation grossly intact in extremities.  Symmetrical.  Speech is fluent.  Psychiatric:  Judgment intact, Mood & affect appropriate for pt's clinical situation. Dermatologic: No rashes or ulcers noted.  No cellulitis or open wounds.       Labs No results found for this or any previous visit (from the past 2160 hour(s)).  Radiology No results found.   Assessment/Plan  Diabetes (HCC) blood glucose control important in reducing the progression of atherosclerotic disease. Also, involved in wound healing. On appropriate medications.   Essential hypertension blood pressure control important in reducing the progression of atherosclerotic disease. On appropriate oral medications.   Swelling of limb At current, her swelling is actually quite well-controlled despite not using her compression stockings. She can use her compression stockings as needed at this point. She should continue to remain active and elevate her legs. I have offered her an annual follow-up or to call us if the symptoms worsen and she would prefer the latter. I will see her back as needed.    Leotis Pain, MD  11/11/2016 2:44 PM    This note was created with Dragon medical transcription system.  Any errors from dictation are purely unintentional

## 2016-11-11 NOTE — Assessment & Plan Note (Signed)
blood glucose control important in reducing the progression of atherosclerotic disease. Also, involved in wound healing. On appropriate medications.  

## 2016-12-26 DIAGNOSIS — Z96652 Presence of left artificial knee joint: Secondary | ICD-10-CM | POA: Insufficient documentation

## 2017-05-27 NOTE — Progress Notes (Deleted)
05/28/2017 8:01 PM   Dana Green 1929/09/07 [id number]  Referring provider: Tracie Harrier, MD [address] [place] [facility] [place], [place] [address]  No chief complaint on file.   HPI: Patient is a 84 year old Caucasian female who presents today for a one month follow up for incontinence, vaginal atrophy and nocturia.  Incontinence Patient is currently on Ditropan XL 15 mg daily.  The patient has been experiencing urgency x *** (***), frequency x *** (***), not/is restricting fluids to avoid visits to the restroom ***, not/is engaging in toilet mapping, incontinence x *** (***) and nocturia x *** (***).  Her PVR is ***.     Vaginal atrophy Patient is applying the vaginal estrogen cream three nights weekly.    Nocturia ***   PMH: Past Medical History:  Diagnosis Date  . Arthritis   . Chronic diarrhea   . Diabetes mellitus without complication ([place])   . Gout   . Hypercholesteremia   . Hypertension   . Spastic colon     Surgical History: Past Surgical History:  Procedure Laterality Date  . ABDOMINAL HYSTERECTOMY    . APPENDECTOMY    . BRAIN SURGERY     Mass Removal; Anuerysm Repair  . BREAST BIOPSY Left    neg  . CHOLECYSTECTOMY    . Floating Kidney Repair    . REPLACEMENT TOTAL KNEE Left 2000    Home Medications:  Allergies as of 05/28/2017      Reactions   Aspirin Other (See Comments)   Pt states she has metal clips and was told not to take asa   Butalbital-apap-caffeine Other (See Comments)   Ciprofloxacin Other (See Comments)   Diazepam Other (See Comments)   Ibuprofen Other (See Comments)   Lovastatin Other (See Comments)   Other reaction(s): Unknown   Other    Other reaction(s): Unknown   Propoxyphene Other (See Comments)   Statins    Other reaction(s): Muscle Pain Other reaction(s): Muscle Pain   Sulfa Antibiotics Other (See Comments)   Tapentadol Diarrhea      Medication List        Accurate as of 05/27/17  8:01  PM. Always use your most recent med list.          allopurinol 100 MG tablet Commonly known as:  ZYLOPRIM Take by mouth.   ALPRAZolam 0.25 MG tablet Commonly known as:  XANAX Take by mouth.   amLODipine 2.5 MG tablet Commonly known as:  NORVASC Take 5 mg by mouth.   colchicine 0.6 MG tablet Take by mouth.   colestipol 1 g tablet Commonly known as:  COLESTID Take by mouth.   conjugated estrogens vaginal cream Commonly known as:  PREMARIN Place 1 Applicatorful vaginally daily. Apply 0.5mg  (pea-sized amount)  just inside the vaginal introitus with a finger-tip every night for two weeks and then Monday, Wednesday and Friday nights.   estradiol 0.1 MG/GM vaginal cream Commonly known as:  ESTRACE Place 1 Applicatorful vaginally at bedtime.   estradiol 0.1 MG/GM vaginal cream Commonly known as:  ESTRACE VAGINAL Apply 0.5mg  (pea-sized amount)  just inside the vaginal introitus with a finger-tip every night for two weeks and then Monday, Wednesday and Friday nights.   fluticasone 50 MCG/ACT nasal spray Commonly known as:  FLONASE Place into the nose.   glipiZIDE 10 MG tablet Commonly known as:  GLUCOTROL TAKE ONE TABLET BY MOUTH ONCE DAILY   loratadine 10 MG tablet Commonly known as:  CLARITIN Take by mouth.  lovastatin 20 MG tablet Commonly known as:  MEVACOR TAKE TWO TABLETS BY MOUTH ONCE DAILY   metFORMIN 1000 MG tablet Commonly known as:  GLUCOPHAGE TAKE ONE TABLET BY MOUTH TWICE DAILY   metoprolol succinate 50 MG 24 hr tablet Commonly known as:  TOPROL-XL Take by mouth.   OCUVITE ADULT FORMULA Caps Take by mouth.   omeprazole 20 MG capsule Commonly known as:  PRILOSEC Take one daily   oxybutynin 15 MG 24 hr tablet Commonly known as:  DITROPAN XL Take 1 tablet (15 mg total) by mouth daily.   ranitidine 150 MG capsule Commonly known as:  ZANTAC Take 1 capsule (150 mg total) by mouth 2 (two) times daily.   sitaGLIPtin 50 MG tablet Commonly known  as:  JANUVIA Take 50 mg by mouth daily.       Allergies:  Allergies  Allergen Reactions  . Aspirin Other (See Comments)    Pt states she has metal clips and was told not to take asa  . Butalbital-Apap-Caffeine Other (See Comments)  . Ciprofloxacin Other (See Comments)  . Diazepam Other (See Comments)  . Ibuprofen Other (See Comments)  . Lovastatin Other (See Comments)    Other reaction(s): Unknown  . Other     Other reaction(s): Unknown  . Propoxyphene Other (See Comments)  . Statins     Other reaction(s): Muscle Pain Other reaction(s): Muscle Pain  . Sulfa Antibiotics Other (See Comments)  . Tapentadol Diarrhea    Family History: Family History  Problem Relation Age of Onset  . Prostate cancer Father   . Bladder Cancer Father   . Prostate cancer Brother   . Kidney disease Neg Hx     Social History:  reports that  has never smoked. she has never used smokeless tobacco. She reports that she does not drink alcohol or use drugs.  ROS:                                        Physical Exam: There were no vitals taken for this visit.  Constitutional: Well nourished. Alert and oriented, No acute distress. HEENT: [place] AT, moist mucus membranes. Trachea midline, no masses. Cardiovascular: No clubbing, cyanosis, or edema. Respiratory: Normal respiratory effort, no increased work of breathing. Skin: No rashes, bruises or suspicious lesions. Lymph: No cervical or inguinal adenopathy. Neurologic: Grossly intact, no focal deficits, moving all 4 extremities. Psychiatric: Normal mood and affect.  Constitutional: Well nourished. Alert and oriented, No acute distress. HEENT: [place] AT, moist mucus membranes. Trachea midline, no masses. Cardiovascular: No clubbing, cyanosis, or edema. Respiratory: Normal respiratory effort, no increased work of breathing. GI: Abdomen is soft, non tender, non distended, no abdominal masses. Liver and spleen not palpable.  No  hernias appreciated.  Stool sample for occult testing is not indicated.   GU: No CVA tenderness.  No bladder fullness or masses.  Normal external genitalia, normal pubic hair distribution, no lesions.  Normal urethral meatus, no lesions, no prolapse, no discharge.   No urethral masses, tenderness and/or tenderness. No bladder fullness, tenderness or masses. Normal vagina mucosa, good estrogen effect, no discharge, no lesions, good pelvic support, no cystocele or rectocele noted.  No cervical motion tenderness.  Uterus is freely mobile and non-fixed.  No adnexal/parametria masses or tenderness noted.  Anus and perineum are without rashes or lesions.   *** Skin: No rashes, bruises or suspicious lesions. Lymph: No  cervical or inguinal adenopathy. Neurologic: Grossly intact, no focal deficits, moving all 4 extremities. Psychiatric: Normal mood and affect.   Laboratory Data: Creatinine was 0.8 in 03/2017  I have reviewed the labs  Pertinent Imaging: ***   Assessment & Plan:                 1. Nocturia  - she states that she is no longer getting up at night  - continue the Ditropan XL 15 mg daily; refills given  2. Vaginal atrophy  - using vaginal estrogen cream three nights weekly; refills given  - RTC in one year for exam  3. Mixed incontinence  - resolved  - continue oxybutynin XL 15 mg daily  - stop sweet tea and diet drinks  - RTC in one month for OAB questionnaire and PVR   No Follow-up on file.  These notes generated with voice recognition software. I apologize for typographical errors.  Zara Council, [facility] [address], [place], [place] [address] [phone]

## 2017-05-28 ENCOUNTER — Ambulatory Visit: Payer: Medicare Other | Admitting: Urology

## 2017-05-28 ENCOUNTER — Encounter: Payer: Self-pay | Admitting: Urology

## 2017-12-27 ENCOUNTER — Emergency Department: Payer: Medicare Other

## 2017-12-27 ENCOUNTER — Observation Stay
Admission: EM | Admit: 2017-12-27 | Discharge: 2017-12-28 | Disposition: A | Discharge status: Home / Self Care | Payer: Medicare Other | Attending: Internal Medicine | Admitting: Internal Medicine

## 2017-12-27 ENCOUNTER — Other Ambulatory Visit: Payer: Self-pay

## 2017-12-27 ENCOUNTER — Encounter: Payer: Self-pay | Admitting: Emergency Medicine

## 2017-12-27 DIAGNOSIS — K219 Gastro-esophageal reflux disease without esophagitis: Secondary | ICD-10-CM | POA: Insufficient documentation

## 2017-12-27 DIAGNOSIS — I447 Left bundle-branch block, unspecified: Secondary | ICD-10-CM | POA: Insufficient documentation

## 2017-12-27 DIAGNOSIS — M109 Gout, unspecified: Secondary | ICD-10-CM | POA: Diagnosis not present

## 2017-12-27 DIAGNOSIS — R61 Generalized hyperhidrosis: Secondary | ICD-10-CM | POA: Diagnosis not present

## 2017-12-27 DIAGNOSIS — M069 Rheumatoid arthritis, unspecified: Secondary | ICD-10-CM | POA: Insufficient documentation

## 2017-12-27 DIAGNOSIS — E119 Type 2 diabetes mellitus without complications: Secondary | ICD-10-CM | POA: Insufficient documentation

## 2017-12-27 DIAGNOSIS — R0789 Other chest pain: Secondary | ICD-10-CM | POA: Diagnosis not present

## 2017-12-27 DIAGNOSIS — Z882 Allergy status to sulfonamides status: Secondary | ICD-10-CM | POA: Diagnosis not present

## 2017-12-27 DIAGNOSIS — E782 Mixed hyperlipidemia: Secondary | ICD-10-CM | POA: Insufficient documentation

## 2017-12-27 DIAGNOSIS — R9431 Abnormal electrocardiogram [ECG] [EKG]: Secondary | ICD-10-CM | POA: Diagnosis not present

## 2017-12-27 DIAGNOSIS — I200 Unstable angina: Secondary | ICD-10-CM | POA: Diagnosis present

## 2017-12-27 DIAGNOSIS — M1990 Unspecified osteoarthritis, unspecified site: Secondary | ICD-10-CM | POA: Diagnosis not present

## 2017-12-27 DIAGNOSIS — I10 Essential (primary) hypertension: Secondary | ICD-10-CM | POA: Diagnosis not present

## 2017-12-27 DIAGNOSIS — Z8774 Personal history of (corrected) congenital malformations of heart and circulatory system: Secondary | ICD-10-CM | POA: Diagnosis not present

## 2017-12-27 DIAGNOSIS — Z7984 Long term (current) use of oral hypoglycemic drugs: Secondary | ICD-10-CM | POA: Diagnosis not present

## 2017-12-27 LAB — CBC
HCT: 40 % (ref 35.0–47.0)
Hemoglobin: 14 g/dL (ref 12.0–16.0)
MCH: 30.6 pg (ref 26.0–34.0)
MCHC: 35.1 g/dL (ref 32.0–36.0)
MCV: 87.4 fL (ref 80.0–100.0)
PLATELETS: 290 10*3/uL (ref 150–440)
RBC: 4.58 MIL/uL (ref 3.80–5.20)
RDW: 15.1 % — AB (ref 11.5–14.5)
WBC: 11.3 10*3/uL — ABNORMAL HIGH (ref 3.6–11.0)

## 2017-12-27 LAB — PROTIME-INR
INR: 1.02
PROTHROMBIN TIME: 13.3 s (ref 11.4–15.2)

## 2017-12-27 LAB — TROPONIN I
Troponin I: <0.03 ng/mL (ref <0.03)
Troponin I: <0.03 ng/mL (ref <0.03)

## 2017-12-27 LAB — GLUCOSE, CAPILLARY
Glucose-Capillary: 71 mg/dL (ref 70–99)
Glucose-Capillary: 142 mg/dL — ABNORMAL HIGH (ref 70–99)

## 2017-12-27 LAB — BASIC METABOLIC PANEL
Anion gap: 12 (ref 5–15)
BUN: 24 mg/dL — AB (ref 8–23)
CALCIUM: 8.8 mg/dL — AB (ref 8.9–10.3)
CHLORIDE: 106 mmol/L (ref 98–111)
CO2: 20 mmol/L — AB (ref 22–32)
CREATININE: 0.89 mg/dL (ref 0.44–1.00)
GFR calc non Af Amer: 57 mL/min — ABNORMAL LOW (ref >60)
Glucose, Bld: 139 mg/dL — ABNORMAL HIGH (ref 70–99)
Potassium: 4.3 mmol/L (ref 3.5–5.1)
SODIUM: 138 mmol/L (ref 135–145)

## 2017-12-27 LAB — APTT: aPTT: 31 seconds (ref 24–36)

## 2017-12-27 MED ORDER — OXYBUTYNIN CHLORIDE ER 5 MG PO TB24
15.0000 mg | ORAL_TABLET | Freq: Every day | ORAL | Status: DC
  Administered 2017-12-28: 15 mg via ORAL
  Filled 2017-12-27: qty 1

## 2017-12-27 MED ORDER — ONDANSETRON HCL 4 MG/2ML IJ SOLN: 4.0000 mg | Freq: Four times a day (QID) | INTRAMUSCULAR | Status: DC | PRN

## 2017-12-27 MED ORDER — OCUVITE-LUTEIN PO CAPS
1.0000 | ORAL_CAPSULE | Freq: Every day | ORAL | Status: DC
  Administered 2017-12-28: 1 via ORAL
  Filled 2017-12-27: qty 1

## 2017-12-27 MED ORDER — PANTOPRAZOLE SODIUM 40 MG PO TBEC
40.0000 mg | DELAYED_RELEASE_TABLET | Freq: Every day | ORAL | Status: DC
  Administered 2017-12-28: 40 mg via ORAL
  Filled 2017-12-27: qty 1

## 2017-12-27 MED ORDER — ACETAMINOPHEN 650 MG RE SUPP: 650.0000 mg | Freq: Four times a day (QID) | RECTAL | Status: DC | PRN

## 2017-12-27 MED ORDER — ALLOPURINOL 100 MG PO TABS
100.0000 mg | ORAL_TABLET | Freq: Two times a day (BID) | ORAL | Status: DC
  Administered 2017-12-28: 100 mg via ORAL
  Filled 2017-12-27 (×3): qty 1

## 2017-12-27 MED ORDER — HEPARIN (PORCINE) IN NACL 100-0.45 UNIT/ML-% IJ SOLN
950.0000 [IU]/h | INTRAMUSCULAR | Status: DC
  Administered 2017-12-27: 950 [IU]/h via INTRAVENOUS
  Filled 2017-12-27: qty 250

## 2017-12-27 MED ORDER — METOPROLOL SUCCINATE ER 50 MG PO TB24
50.0000 mg | ORAL_TABLET | Freq: Every day | ORAL | Status: DC
  Administered 2017-12-28: 50 mg via ORAL
  Filled 2017-12-27: qty 1

## 2017-12-27 MED ORDER — INSULIN ASPART 100 UNIT/ML ~~LOC~~ SOLN: 0.0000 [IU] | Freq: Every day | SUBCUTANEOUS | Status: DC

## 2017-12-27 MED ORDER — INSULIN ASPART 100 UNIT/ML ~~LOC~~ SOLN
0.0000 [IU] | Freq: Three times a day (TID) | SUBCUTANEOUS | Status: DC
  Administered 2017-12-28: 1 [IU] via SUBCUTANEOUS
  Filled 2017-12-27 (×2): qty 1

## 2017-12-27 MED ORDER — LORATADINE 10 MG PO TABS
10.0000 mg | ORAL_TABLET | Freq: Every day | ORAL | Status: DC
  Administered 2017-12-28: 10 mg via ORAL
  Filled 2017-12-27: qty 1

## 2017-12-27 MED ORDER — MORPHINE SULFATE (PF) 2 MG/ML IV SOLN: 2.0000 mg | INTRAVENOUS | Status: DC | PRN

## 2017-12-27 MED ORDER — ONDANSETRON HCL 4 MG PO TABS: 4.0000 mg | ORAL_TABLET | Freq: Four times a day (QID) | ORAL | Status: DC | PRN

## 2017-12-27 MED ORDER — AMLODIPINE BESYLATE 5 MG PO TABS
5.0000 mg | ORAL_TABLET | Freq: Every day | ORAL | Status: DC
  Administered 2017-12-28: 5 mg via ORAL
  Filled 2017-12-27: qty 1

## 2017-12-27 MED ORDER — ACETAMINOPHEN 325 MG PO TABS: 650.0000 mg | ORAL_TABLET | Freq: Four times a day (QID) | ORAL | Status: DC | PRN

## 2017-12-27 MED ORDER — LEFLUNOMIDE 20 MG PO TABS
20.0000 mg | ORAL_TABLET | Freq: Every day | ORAL | Status: DC
  Administered 2017-12-28: 20 mg via ORAL
  Filled 2017-12-27: qty 1

## 2017-12-27 NOTE — ED Provider Notes (Signed)
[facility] Emergency Department Provider Note  ____________________________________________   First MD Initiated Contact with Patient 12/27/17 1407     (approximate)  I have reviewed the triage vital signs and the nursing notes.   HISTORY  Chief Complaint Chest Pain    HPI Dana Green is a 84 y.o. female with medical history as listed below who presents for evaluation of acute onset and severe chest pain.  She states that it awoke her from sleep last night at about 11:30 PM and lasted for nearly 4 hours.  It then went away and she was able to go back to sleep but when she got up again today the pain started over again.  It is located in the right side of her chest she reports that it feels like a pressure and sharp pain that also radiates down her right arm into her hand although that is intermittent.  She describes the onset is acute, the pain and pressure severe, and nothing in particular makes it better or worse.  The chest pain is associated with shortness of breath as well as some nausea.  She has had no dizziness nor lightheadedness.  She currently reports that there is no pain but she still feels a sense of pressure in the right side of her chest.  She has no active diagnoses of cancer, no recent surgeries, no immobilizations.  She does not take any anticoagulation because she had an AV malformation or brain aneurysm about 30 years ago and was told not to ever take any anticoagulation.  Past Medical History:  Diagnosis Date  . Arthritis   . Chronic diarrhea   . Diabetes mellitus without complication ([place])   . Gout   . Hypercholesteremia   . Hypertension   . Spastic colon     Patient Active Problem List   Diagnosis Date Noted  . Unstable angina ([place]) 12/27/2017  . Diabetes ([place]) 11/11/2016  . Essential hypertension 11/11/2016  . Swelling of limb 11/11/2016    Past Surgical History:  Procedure Laterality Date  . ABDOMINAL HYSTERECTOMY      . APPENDECTOMY    . BRAIN SURGERY     Mass Removal; Anuerysm Repair  . BREAST BIOPSY Left    neg  . CHOLECYSTECTOMY    . Floating Kidney Repair    . REPLACEMENT TOTAL KNEE Left 2000    Prior to Admission medications   Medication Sig Start Date End Date Taking? Authorizing Provider  allopurinol (ZYLOPRIM) 100 MG tablet Take 100 mg by mouth 2 (two) times daily.    Yes [provider]  amLODipine (NORVASC) 5 MG tablet Take 5 mg by mouth daily.    Yes [provider]  doxycycline (DORYX) 100 MG EC tablet Take 100 mg by mouth 2 (two) times daily.   Yes [provider]  glipiZIDE (GLUCOTROL) 10 MG tablet Take 10 mg by mouth 2 (two) times daily before a meal.  11/26/15  Yes [provider]  leflunomide (ARAVA) 20 MG tablet Take 20 mg by mouth daily.   Yes [provider]  loratadine (CLARITIN) 10 MG tablet Take 10 mg by mouth daily.    Yes [provider]  metFORMIN (GLUCOPHAGE) 1000 MG tablet TAKE ONE TABLET BY MOUTH TWICE DAILY 12/07/14  Yes [provider]  metoprolol succinate (TOPROL-XL) 50 MG 24 hr tablet Take 50 mg by mouth daily.    Yes [provider]  Multiple Vitamins-Minerals (OCUVITE ADULT FORMULA) CAPS Take 1 capsule  by mouth daily.    Yes [provider]  omeprazole (PRILOSEC) 20 MG capsule Take 20 mg by mouth daily.  02/28/15  Yes [provider]  oxybutynin (DITROPAN XL) 15 MG 24 hr tablet Take 1 tablet (15 mg total) by mouth daily. 05/29/16  Yes McGowan, Larene Beach A, PA-C  sitaGLIPtin (JANUVIA) 50 MG tablet Take 50 mg by mouth daily.   Yes [provider]  colestipol (COLESTID) 1 g tablet Take 1 g by mouth daily as needed.  05/30/15   [provider]  conjugated estrogens (PREMARIN) vaginal cream Place 1 Applicatorful vaginally daily. Apply 0.5mg  (pea-sized amount)  just inside the vaginal introitus with a finger-tip every night for two weeks and then Monday, Wednesday and  Friday nights. Patient not taking: Reported on 11/11/2016 05/29/16   Zara Council A, PA-C  estradiol (ESTRACE VAGINAL) 0.1 MG/GM vaginal cream Apply 0.5mg  (pea-sized amount)  just inside the vaginal introitus with a finger-tip every night for two weeks and then Monday, Wednesday and Friday nights. Patient not taking: Reported on 11/11/2016 05/29/16   Zara Council A, PA-C  ranitidine (ZANTAC) 150 MG capsule Take 1 capsule (150 mg total) by mouth 2 (two) times daily. Patient not taking: Reported on 05/29/2016 10/08/14   Carrie Mew, MD    Allergies Aspirin; Butalbital-apap-caffeine; Ciprofloxacin; Diazepam; Ibuprofen; Lovastatin; Other; Propoxyphene; Statins; Sulfa antibiotics; and Tapentadol  Family History  Problem Relation Age of Onset  . Prostate cancer Father   . Bladder Cancer Father   . Prostate cancer Brother   . Kidney disease Neg Hx     Social History Social History   Tobacco Use  . Smoking status: Never Smoker  . Smokeless tobacco: Never Used  Substance Use Topics  . Alcohol use: No  . Drug use: No    Review of Systems Constitutional: No fever/chills Eyes: No visual changes. ENT: No sore throat. Cardiovascular: Chest pain as described above Respiratory: SOB associated with CP Gastrointestinal: No abdominal pain.  Nausea.  No diarrhea.  No constipation. Genitourinary: Negative for dysuria. Musculoskeletal: Negative for neck pain.  Negative for back pain. Integumentary: Negative for rash. Neurological: Negative for headaches, focal weakness or numbness.   ____________________________________________   PHYSICAL EXAM:  VITAL SIGNS: ED Triage Vitals  Enc Vitals Group     BP 12/27/17 1335 (!) 168/55     Pulse Rate 12/27/17 1335 77     Resp 12/27/17 1335 16     Temp 12/27/17 1335 97.7 F (36.5 C)     Temp Source 12/27/17 1335 Oral     SpO2 12/27/17 1335 98 %     Weight 12/27/17 1335 77.1 kg (170 lb)     Height 12/27/17 1335 1.626 m (5\' 4" )      Head Circumference --      Peak Flow --      Pain Score 12/27/17 1334 8     Pain Loc --      Pain Edu? --      Excl. in [place]? --     Constitutional: Alert and oriented. Well appearing and in no acute distress. Eyes: Conjunctivae are normal.  Head: Atraumatic. Nose: No congestion/rhinnorhea. Mouth/Throat: Mucous membranes are moist. Neck: No stridor.  No meningeal signs.   Cardiovascular: Normal rate, regular rhythm. Good peripheral circulation. Grossly normal heart sounds. Respiratory: Normal respiratory effort.  No retractions. Lungs CTAB. Gastrointestinal: Soft and nontender. No distention.  Musculoskeletal: No lower extremity tenderness nor edema. No gross deformities of extremities. Neurologic:  Normal speech  and language. No gross focal neurologic deficits are appreciated.  Skin:  Skin is warm, dry and intact. No rash noted. Psychiatric: Mood and affect are normal. Speech and behavior are normal.  ____________________________________________   LABS (all labs ordered are listed, but only abnormal results are displayed)  Labs Reviewed  BASIC METABOLIC PANEL - Abnormal; Notable for the following components:      Result Value   CO2 20 (*)    Glucose, Bld 139 (*)    BUN 24 (*)    Calcium 8.8 (*)    GFR calc non Af Amer 57 (*)    All other components within normal limits  CBC - Abnormal; Notable for the following components:   WBC 11.3 (*)    RDW 15.1 (*)    All other components within normal limits  TROPONIN I  HEPARIN LEVEL (UNFRACTIONATED)  APTT  PROTIME-INR   ____________________________________________  EKG  ED ECG REPORT I, Hinda Kehr, the attending physician, personally viewed and interpreted this ECG.  Date: 12/27/2017 EKG Time: 13: 33 Rate: 74 Rhythm: normal sinus rhythm QRS Axis: Right axis deviation Intervals: normal ST/T Wave abnormalities: ST depression in leads II, III, and aVF.  There is also approximately 1 mm of ST elevation in aVR, and  notched T waves in leads V5 and V6.  These are all concerning for ischemic changes in the inferior leads. Narrative Interpretation: Concerning for ischemic changes in the setting of new onset acute chest pain.  Significantly change from last prior EKG from 2016   ____________________________________________  RADIOLOGY I, Hinda Kehr, personally viewed and evaluated these images (plain radiographs) as part of my medical decision making, as well as reviewing the written report by the radiologist.  ED MD interpretation:  No acute abnormalities, no mediastinal widening  Official radiology report(s): Dg Chest 2 View  Result Date: 12/27/2017 CLINICAL DATA:  Chest pain started last night. EXAM: CHEST - 2 VIEW COMPARISON:  10/06/2014 FINDINGS: The heart size and mediastinal contours are within normal limits. Both lungs are clear. The visualized skeletal structures are unremarkable. IMPRESSION: No active cardiopulmonary disease. Electronically Signed   By: Kathreen Devoid   On: 12/27/2017 13:59    ____________________________________________   PROCEDURES  Critical Care performed: Yes, see critical care procedure note(s)   Procedure(s) performed:   .Critical Care Performed by: Hinda Kehr, MD Authorized by: Hinda Kehr, MD   Critical care provider statement:    Critical care time (minutes):  30   Critical care time was exclusive of:  Separately billable procedures and treating other patients   Critical care was necessary to treat or prevent imminent or life-threatening deterioration of the following conditions: unstable angina.   Critical care was time spent personally by me on the following activities:  Development of treatment plan with patient or surrogate, discussions with consultants, evaluation of patient's response to treatment, examination of patient, obtaining history from patient or surrogate, ordering and performing treatments and interventions, ordering and review of laboratory  studies, ordering and review of radiographic studies, pulse oximetry, re-evaluation of patient's condition and review of old charts     ____________________________________________   INITIAL IMPRESSION / [place] / ED COURSE  As part of my medical decision making, I reviewed the following data within the [facility] History obtained from family, Nursing notes reviewed and incorporated, Labs reviewed , EKG interpreted , Old EKG reviewed, Old chart reviewed, Radiograph reviewed , Discussed with admitting physician  and Notes from prior ED visits  Differential diagnosis includes, but is not limited to, ACS, dissection, PE, pneumonia, musculoskeletal pain.  I reviewed the medical record and the patient sees Dr. Ubaldo Glassing but is not being treated for coronary artery disease.  However she has multiple risk factors and her history is quite concerning in terms of right-sided chest pain and pressure that awoke her from sleep, has happened recurrently, and is rating down her right arm.  She has no risk factors for PE and her chest x-ray is reassuring with no sign of pneumonia and no widened mediastinum.  She is concerned about anticoagulation because of what she was told by her neurosurgeon 30+ years ago in terms of not taking any blood thinners at all.  Fortunately her troponin is negative even though she does have some ischemic changes on her EKG.  After a discussion with the patient and her daughter we agreed that I will admit her for chest pain observation to the hospital given that I believe she technically is suffering from unstable angina but does not want medical treatment at this time.  We will have cardiology see her in the hospital and likely she will need a neurosurgical evaluation to provide reassurance and in a "okay" for anticoagulation therapy.  The patient and her daughter understand and agree with the plan.  Clinical Course as of Dec 28 1510  Sun Dec 27, 2017  1444  Discussed case with Dr. Verdell Carmine with the hospitalist service who will admit.  He will likely start heparin but will talk with her first.   [CF]  24 Dr. Verdell Carmine spoke with patient and we are proceeding with heparin treatment starting int he emergency department for unstable angina.   [CF]    Clinical Course User Index [CF] Hinda Kehr, MD    ____________________________________________  FINAL CLINICAL IMPRESSION(S) / ED DIAGNOSES  Final diagnoses:  Unstable angina ([place])     MEDICATIONS GIVEN DURING THIS VISIT:  Medications  insulin aspart (novoLOG) injection 0-9 Units (has no administration in time range)  insulin aspart (novoLOG) injection 0-5 Units (has no administration in time range)  heparin ADULT infusion 100 units/mL (25000 units/282mL sodium chloride 0.45%) (has no administration in time range)     ED Discharge Orders    None       Note:  This document was prepared using Dragon voice recognition software and may include unintentional dictation errors.    Hinda Kehr, MD 12/27/17 [phone]

## 2017-12-27 NOTE — Plan of Care (Addendum)
No c/o chest pain since admitted to floor, updated patient and family with plan of care, will continue to assess and monitor.   Problem: Pain Managment: Goal: General experience of comfort will improve Outcome: Progressing   Problem: Safety: Goal: Ability to remain free from injury will improve Outcome: Progressing   Problem: Education: Goal: Knowledge of General Education information will improve Description Including pain rating scale, medication(s)/side effects and non-pharmacologic comfort measures Outcome: Progressing

## 2017-12-27 NOTE — ED Triage Notes (Signed)
Pt to ED via POV c/o chest pain that started last night. Pt states that the pain eased off around 0330. When she woke up this morning her chest started to hurt again. Pain is located in the right side of her chest. Pt denies radiation of the pain. Pt states that she does have some numbness in the right hand that started sometime after she got up this morning. Pt states that is having shortness of breath, N/V. Pt denies dizziness. Pt is in NAD at this time.

## 2017-12-27 NOTE — Progress Notes (Signed)
[place] for heparin Indication: chest pain/ACS  Allergies  Allergen Reactions  . Aspirin Other (See Comments)    Pt states she has metal clips and was told not to take asa  . Butalbital-Apap-Caffeine Other (See Comments)  . Ciprofloxacin Other (See Comments)  . Diazepam Other (See Comments)  . Ibuprofen Other (See Comments)  . Lovastatin Other (See Comments)    Other reaction(s): Unknown  . Other     Other reaction(s): Unknown  . Propoxyphene Other (See Comments)  . Statins     Other reaction(s): Muscle Pain Other reaction(s): Muscle Pain  . Sulfa Antibiotics Other (See Comments)  . Tapentadol Diarrhea    Patient Measurements: Height: 5\' 4"  (162.6 cm) Weight: 170 lb (77.1 kg) IBW/kg (Calculated) : 54.7 Heparin Dosing Weight: 71kg  Vital Signs: Temp: 97.7 F (36.5 C) (08/11 1335) Temp Source: Oral (08/11 1335) BP: 168/55 (08/11 1335) Pulse Rate: 77 (08/11 1335)  Labs: Recent Labs    12/27/17 1345  HGB 14.0  HCT 40.0  PLT 290  CREATININE 0.89  TROPONINI <0.03    Estimated Creatinine Clearance: 45.6 mL/min (by C-G formula based on SCr of 0.89 mg/dL).   Medical History: Past Medical History:  Diagnosis Date  . Arthritis   . Chronic diarrhea   . Diabetes mellitus without complication ([place])   . Gout   . Hypercholesteremia   . Hypertension   . Spastic colon     Assessment: 84 y.o. female who presents for evaluation of acute onset and severe chest pain. Pertinent PMH includes DM and AV malformation or brain aneurism 30 years ago. She is on no anticoagulants PTA. Baseline labs have been ordered  Goal of Therapy:  Heparin level 0.3-0.7 units/ml Monitor platelets by anticoagulation protocol: Yes   Plan:  Bolus dose is deferred by provider due to h/o brain aneurism Start heparin infusion at 950 units/hr Check anti-Xa level in 8 hours and daily while on heparin Continue to monitor H&H and platelets  Dallie Piles, PharmD 12/27/2017,3:02 PM

## 2017-12-27 NOTE — H&P (Signed)
[facility] [place] NAME: Dana Green    MR#:  [id number]  DATE OF BIRTH:  Aug 11, 1929  DATE OF ADMISSION:  12/27/2017  PRIMARY CARE PHYSICIAN: Tracie Harrier, MD   REQUESTING/REFERRING PHYSICIAN: Dr. Hinda Kehr.    CHIEF COMPLAINT:   Chief Complaint  Patient presents with  . Chest Pain    HISTORY OF PRESENT ILLNESS:  Dana Green  is a 84 y.o. female with a known history of hypertension, hyperlipidemia, history of gout, osteoarthritis, history of diabetes, previous history of craniotomy with removal of AV malformations and aneurysm who presents to the hospital due to chest pain.  Patient describes her pain is located in the center of her chest pain to her right arm with her right arm getting somewhat numb.  The pain was significant that it woke her up from sleep in the middle of the night.  It resolved on its own and then recurred this morning and therefore she was concerned and came to the ER for further evaluation.  Patient did have some nausea but no vomiting associated with her chest pain.  She denies any diaphoresis palpitations or syncope.  Patient denies any previous history of MI or any history of coronary artery disease.  Patient's EKG done in the ER shows mild ST depressions in the lateral leads which are new from her previous EKG.  Given her symptoms and EKG changes hospitalist services were contacted for admission.  PAST MEDICAL HISTORY:   Past Medical History:  Diagnosis Date  . Arthritis   . Chronic diarrhea   . Diabetes mellitus without complication ([place])   . Gout   . Hypercholesteremia   . Hypertension   . Spastic colon     PAST SURGICAL HISTORY:   Past Surgical History:  Procedure Laterality Date  . ABDOMINAL HYSTERECTOMY    . APPENDECTOMY    . BRAIN SURGERY     Mass Removal; Anuerysm Repair  . BREAST BIOPSY Left    neg  . CHOLECYSTECTOMY    . Floating Kidney Repair    . REPLACEMENT TOTAL KNEE Left 2000     SOCIAL HISTORY:   Social History   Tobacco Use  . Smoking status: Never Smoker  . Smokeless tobacco: Never Used  Substance Use Topics  . Alcohol use: No    FAMILY HISTORY:   Family History  Problem Relation Age of Onset  . Prostate cancer Father   . Bladder Cancer Father   . Prostate cancer Brother   . Kidney disease Neg Hx     DRUG ALLERGIES:   Allergies  Allergen Reactions  . Aspirin Other (See Comments)    Pt states she has metal clips and was told not to take asa  . Butalbital-Apap-Caffeine Other (See Comments)  . Ciprofloxacin Other (See Comments)  . Diazepam Other (See Comments)  . Ibuprofen Other (See Comments)  . Lovastatin Other (See Comments)    Other reaction(s): Unknown  . Other     Other reaction(s): Unknown  . Propoxyphene Other (See Comments)  . Statins     Other reaction(s): Muscle Pain Other reaction(s): Muscle Pain  . Sulfa Antibiotics Other (See Comments)  . Tapentadol Diarrhea    REVIEW OF SYSTEMS:   Review of Systems  Constitutional: Negative for fever and weight loss.  HENT: Negative for congestion, nosebleeds and tinnitus.   Eyes: Negative for blurred vision, double vision and redness.  Respiratory: Negative for cough, hemoptysis and shortness of breath.  Cardiovascular: Positive for chest pain. Negative for orthopnea, leg swelling and PND.  Gastrointestinal: Negative for abdominal pain, diarrhea, melena, nausea and vomiting.  Genitourinary: Negative for dysuria, hematuria and urgency.  Musculoskeletal: Negative for falls and joint pain.  Neurological: Negative for dizziness, tingling, sensory change, focal weakness, seizures, weakness and headaches.  Endo/Heme/Allergies: Negative for polydipsia. Does not bruise/bleed easily.  Psychiatric/Behavioral: Negative for depression and memory loss. The patient is not nervous/anxious.     MEDICATIONS AT HOME:   Prior to Admission medications   Medication Sig Start Date End Date  Taking? Authorizing Provider  allopurinol (ZYLOPRIM) 100 MG tablet Take 100 mg by mouth 2 (two) times daily.    Yes [provider]  amLODipine (NORVASC) 5 MG tablet Take 5 mg by mouth daily.    Yes [provider]  doxycycline (DORYX) 100 MG EC tablet Take 100 mg by mouth 2 (two) times daily.   Yes [provider]  glipiZIDE (GLUCOTROL) 10 MG tablet Take 10 mg by mouth 2 (two) times daily before a meal.  11/26/15  Yes [provider]  leflunomide (ARAVA) 20 MG tablet Take 20 mg by mouth daily.   Yes [provider]  loratadine (CLARITIN) 10 MG tablet Take 10 mg by mouth daily.    Yes [provider]  metFORMIN (GLUCOPHAGE) 1000 MG tablet TAKE ONE TABLET BY MOUTH TWICE DAILY 12/07/14  Yes [provider]  metoprolol succinate (TOPROL-XL) 50 MG 24 hr tablet Take 50 mg by mouth daily.    Yes [provider]  Multiple Vitamins-Minerals (OCUVITE ADULT FORMULA) CAPS Take 1 capsule by mouth daily.    Yes [provider]  omeprazole (PRILOSEC) 20 MG capsule Take 20 mg by mouth daily.  02/28/15  Yes [provider]  oxybutynin (DITROPAN XL) 15 MG 24 hr tablet Take 1 tablet (15 mg total) by mouth daily. 05/29/16  Yes McGowan, Larene Beach A, PA-C  sitaGLIPtin (JANUVIA) 50 MG tablet Take 50 mg by mouth daily.   Yes [provider]  colestipol (COLESTID) 1 g tablet Take 1 g by mouth daily as needed.  05/30/15   [provider]  conjugated estrogens (PREMARIN) vaginal cream Place 1 Applicatorful vaginally daily. Apply 0.5mg  (pea-sized amount)  just inside the vaginal introitus with a finger-tip every night for two weeks and then Monday, Wednesday and Friday nights. Patient not taking: Reported on 11/11/2016 05/29/16   Zara Council A, PA-C  estradiol (ESTRACE VAGINAL) 0.1 MG/GM vaginal cream Apply 0.5mg  (pea-sized amount)  just inside the vaginal introitus with a finger-tip every night for two weeks and then  Monday, Wednesday and Friday nights. Patient not taking: Reported on 11/11/2016 05/29/16   Zara Council A, PA-C  ranitidine (ZANTAC) 150 MG capsule Take 1 capsule (150 mg total) by mouth 2 (two) times daily. Patient not taking: Reported on 05/29/2016 10/08/14   Carrie Mew, MD      VITAL SIGNS:  Blood pressure (!) 168/55, pulse 77, temperature 97.7 F (36.5 C), temperature source Oral, resp. rate 16, height 5\' 4"  (1.626 m), weight 77.1 kg, SpO2 98 %.  PHYSICAL EXAMINATION:  Physical Exam  GENERAL:  84 y.o.-year-old patient lying in the bed with no acute distress.  EYES: Pupils equal, round, reactive to light and accommodation. No scleral icterus. Extraocular muscles intact.  HEENT: Head atraumatic, normocephalic. Oropharynx and nasopharynx clear. No oropharyngeal erythema, moist oral mucosa  NECK:  Supple, no jugular venous distention. No thyroid enlargement, no tenderness.  LUNGS: Normal breath sounds  bilaterally, no wheezing, rales, rhonchi. No use of accessory muscles of respiration.  CARDIOVASCULAR: S1, S2 RRR. No murmurs, rubs, gallops, clicks.  ABDOMEN: Soft, nontender, nondistended. Bowel sounds present. No organomegaly or mass.  EXTREMITIES: No pedal edema, cyanosis, or clubbing. + 2 pedal & radial pulses b/l.   NEUROLOGIC: Cranial nerves II through XII are intact. No focal Motor or sensory deficits appreciated b/l PSYCHIATRIC: The patient is alert and oriented x 3. SKIN: No obvious rash, lesion, or ulcer.   LABORATORY PANEL:   CBC Recent Labs  Lab 12/27/17 1345  WBC 11.3*  HGB 14.0  HCT 40.0  PLT 290   ------------------------------------------------------------------------------------------------------------------  Chemistries  Recent Labs  Lab 12/27/17 1345  NA 138  K 4.3  CL 106  CO2 20*  GLUCOSE 139*  BUN 24*  CREATININE 0.89  CALCIUM 8.8*    ------------------------------------------------------------------------------------------------------------------  Cardiac Enzymes Recent Labs  Lab 12/27/17 1345  TROPONINI <0.03   ------------------------------------------------------------------------------------------------------------------  RADIOLOGY:  Dg Chest 2 View  Result Date: 12/27/2017 CLINICAL DATA:  Chest pain started last night. EXAM: CHEST - 2 VIEW COMPARISON:  10/06/2014 FINDINGS: The heart size and mediastinal contours are within normal limits. Both lungs are clear. The visualized skeletal structures are unremarkable. IMPRESSION: No active cardiopulmonary disease. Electronically Signed   By: Kathreen Devoid   On: 12/27/2017 13:59     IMPRESSION AND PLAN:   84 year old female with past medical history of diabetes, hypertension, history of gout, osteoarthritis, previous history of intracranial aneurysm/AV malformations with craniotomy, history of intracranial hemorrhage who presents to the hospital complaining of chest pain.  1.  Chest pain/unstable angina-this is the working diagnosis given patient's significant chest pain and new EKG changes compared to her previous.  Patient does have ST depressions in the lateral leads which are new.  Her chest pain has now resolved. - We will admit her to the hospital, keep her on telemetry, cycle her cardiac markers.  Her first set is negative. - Given her history of intracranial hemorrhage we will start her on a heparin drip but no bolus.  Continue beta-blocker, an echocardiogram, and a cardiology consult.  2.  DM type II without complication-we will hold her metformin, Januvia, glipizide for now. -We will place on sliding scale insulin.  3.  Essential hypertension-continue Norvasc, Toprol.  4.  History of gout-no acute attack. -Continue allopurinol.  5.  GERD-continue Protonix.  6.  History of urinary incontinence-continue oxybutynin.  7. Hx of Rhematoid Arthritis -  cont. Leflunomide.    All the records are reviewed and case discussed with ED provider. Management plans discussed with the patient, family and they are in agreement.  CODE STATUS: Full code  TOTAL TIME TAKING CARE OF THIS PATIENT: 45 minutes.    Henreitta Leber M.D on 12/27/2017 at 3:07 PM  Between 7am to 6pm - Pager - [phone]  After 6pm go to [email or web address] - password [email or web address] [facility]  Office  [phone]  CC: Primary care physician; Tracie Harrier, MD

## 2017-12-28 ENCOUNTER — Observation Stay
Admit: 2017-12-28 | Discharge: 2017-12-28 | Disposition: A | Discharge status: Home / Self Care | Payer: Medicare Other | Attending: Specialist | Admitting: Specialist

## 2017-12-28 ENCOUNTER — Observation Stay: Payer: Medicare Other

## 2017-12-28 LAB — CBC
HEMATOCRIT: 38.6 % (ref 35.0–47.0)
Hemoglobin: 13 g/dL (ref 12.0–16.0)
MCH: 29.8 pg (ref 26.0–34.0)
MCHC: 33.8 g/dL (ref 32.0–36.0)
MCV: 88 fL (ref 80.0–100.0)
Platelets: 236 10*3/uL (ref 150–440)
RBC: 4.38 MIL/uL (ref 3.80–5.20)
RDW: 14.9 % — ABNORMAL HIGH (ref 11.5–14.5)
WBC: 7.9 10*3/uL (ref 3.6–11.0)

## 2017-12-28 LAB — GLUCOSE, CAPILLARY
GLUCOSE-CAPILLARY: 139 mg/dL — AB (ref 70–99)
Glucose-Capillary: 155 mg/dL — ABNORMAL HIGH (ref 70–99)

## 2017-12-28 LAB — NM MYOCAR MULTI W/SPECT W/WALL MOTION / EF
CHL CUP MPHR: 134 {beats}/min
Estimated workload: 1 METS
Exercise duration (min): 1 min
Exercise duration (sec): 0 s
LV dias vol: 87 mL (ref 46–106)
LVSYSVOL: 54 mL
NUC STRESS TID: 0.9
Peak HR: 94 {beats}/min
Percent HR: 70 %
Rest HR: 67 {beats}/min
SDS: 0
SRS: 5
SSS: 5

## 2017-12-28 LAB — ECHOCARDIOGRAM COMPLETE
Height: 64 in
Weight: 2720 oz

## 2017-12-28 LAB — HEPARIN LEVEL (UNFRACTIONATED)
HEPARIN UNFRACTIONATED: 0.36 [IU]/mL (ref 0.30–0.70)
Heparin Unfractionated: 0.37 IU/mL (ref 0.30–0.70)

## 2017-12-28 LAB — TROPONIN I: Troponin I: <0.03 ng/mL (ref <0.03)

## 2017-12-28 MED ORDER — TECHNETIUM TC 99M TETROFOSMIN IV KIT
29.9000 | PACK | Freq: Once | INTRAVENOUS | Status: AC | PRN
  Administered 2017-12-28: 29.9 via INTRAVENOUS

## 2017-12-28 MED ORDER — ISOSORBIDE MONONITRATE ER 30 MG PO TB24: 30.0000 mg | ORAL_TABLET | Freq: Every day | ORAL | 0 refills | Status: AC

## 2017-12-28 MED ORDER — ISOSORBIDE MONONITRATE ER 30 MG PO TB24: 30.0000 mg | ORAL_TABLET | Freq: Every day | ORAL | Status: DC

## 2017-12-28 MED ORDER — REGADENOSON 0.4 MG/5ML IV SOLN
0.4000 mg | Freq: Once | INTRAVENOUS | Status: AC
  Administered 2017-12-28: 0.4 mg via INTRAVENOUS

## 2017-12-28 MED ORDER — PANTOPRAZOLE SODIUM 40 MG PO TBEC: 40.0000 mg | DELAYED_RELEASE_TABLET | Freq: Every day | ORAL | 11 refills | Status: DC

## 2017-12-28 MED ORDER — TECHNETIUM TC 99M TETROFOSMIN IV KIT
13.0000 | PACK | Freq: Once | INTRAVENOUS | Status: AC | PRN
  Administered 2017-12-28: 12.05 via INTRAVENOUS

## 2017-12-28 NOTE — Progress Notes (Signed)
[place] for heparin Indication: chest pain/ACS  Allergies  Allergen Reactions  . Aspirin Other (See Comments)    Pt states she has metal clips and was told not to take asa  . Butalbital-Apap-Caffeine Other (See Comments)  . Ciprofloxacin Other (See Comments)  . Diazepam Other (See Comments)  . Ibuprofen Other (See Comments)  . Lovastatin Other (See Comments)    Other reaction(s): Unknown  . Other     Other reaction(s): Unknown  . Propoxyphene Other (See Comments)  . Statins     Other reaction(s): Muscle Pain Other reaction(s): Muscle Pain  . Sulfa Antibiotics Other (See Comments)  . Tapentadol Diarrhea    Patient Measurements: Height: 5\' 4"  (162.6 cm) Weight: 170 lb (77.1 kg) IBW/kg (Calculated) : 54.7 Heparin Dosing Weight: 71kg  Vital Signs: Temp: 98 F (36.7 C) (08/11 2019) Temp Source: Oral (08/11 2019) BP: 149/50 (08/11 2019) Pulse Rate: 62 (08/11 2019)  Labs: Recent Labs    12/27/17 1345 12/27/17 1627 12/27/17 1953 12/28/17 0030  HGB 14.0  --   --   --   HCT 40.0  --   --   --   PLT 290  --   --   --   APTT 31  --   --   --   LABPROT 13.3  --   --   --   INR 1.02  --   --   --   HEPARINUNFRC  --   --   --  0.36  CREATININE 0.89  --   --   --   TROPONINI <0.03 <0.03 <0.03  --     Estimated Creatinine Clearance: 45.6 mL/min (by C-G formula based on SCr of 0.89 mg/dL).   Medical History: Past Medical History:  Diagnosis Date  . Arthritis   . Chronic diarrhea   . Diabetes mellitus without complication ([place])   . Gout   . Hypercholesteremia   . Hypertension   . Spastic colon     Assessment: 84 y.o. female who presents for evaluation of acute onset and severe chest pain. Pertinent PMH includes DM and AV malformation or brain aneurism 30 years ago. She is on no anticoagulants PTA. Baseline labs have been ordered  Goal of Therapy:  Heparin level 0.3-0.7 units/ml Monitor platelets by anticoagulation  protocol: Yes   Plan:  Bolus dose is deferred by provider due to h/o brain aneurism Start heparin infusion at 950 units/hr Check anti-Xa level in 8 hours and daily while on heparin Continue to monitor H&H and platelets  08/12 0030 heparin level 0.36. Continue current regimen. Recheck heparin level and CBC in 8 hours.  Annalynne Ibanez S, PharmD 12/28/2017,12:58 AM

## 2017-12-28 NOTE — Progress Notes (Signed)
Dana Green to be D/C'd Home per MD order.  Discussed prescriptions and follow up appointments with the patient. Prescriptions given to patient, medication list explained in detail. Pt verbalized understanding.  Allergies as of 12/28/2017      Reactions   Aspirin Other (See Comments)   Pt states she has metal clips and was told not to take asa   Butalbital-apap-caffeine Other (See Comments)   Ciprofloxacin Other (See Comments)   Diazepam Other (See Comments)   Ibuprofen Other (See Comments)   Lovastatin Other (See Comments)   Other reaction(s): Unknown   Other    Other reaction(s): Unknown   Propoxyphene Other (See Comments)   Statins    Other reaction(s): Muscle Pain Other reaction(s): Muscle Pain   Sulfa Antibiotics Other (See Comments)   Tapentadol Diarrhea      Medication List    STOP taking these medications   conjugated estrogens vaginal cream Commonly known as:  PREMARIN   estradiol 0.1 MG/GM vaginal cream Commonly known as:  ESTRACE   ranitidine 150 MG capsule Commonly known as:  ZANTAC     TAKE these medications   allopurinol 100 MG tablet Commonly known as:  ZYLOPRIM Take 100 mg by mouth 2 (two) times daily.   amLODipine 5 MG tablet Commonly known as:  NORVASC Take 5 mg by mouth daily.   colestipol 1 g tablet Commonly known as:  COLESTID Take 1 g by mouth daily as needed.   doxycycline 100 MG EC tablet Commonly known as:  DORYX Take 100 mg by mouth 2 (two) times daily.   glipiZIDE 10 MG tablet Commonly known as:  GLUCOTROL Take 10 mg by mouth 2 (two) times daily before a meal.   isosorbide mononitrate 30 MG 24 hr tablet Commonly known as:  IMDUR Take 1 tablet (30 mg total) by mouth daily.   leflunomide 20 MG tablet Commonly known as:  ARAVA Take 20 mg by mouth daily.   loratadine 10 MG tablet Commonly known as:  CLARITIN Take 10 mg by mouth daily.   metFORMIN 1000 MG tablet Commonly known as:  GLUCOPHAGE TAKE ONE TABLET BY MOUTH  TWICE DAILY   metoprolol succinate 50 MG 24 hr tablet Commonly known as:  TOPROL-XL Take 50 mg by mouth daily.   OCUVITE ADULT FORMULA Caps Take 1 capsule by mouth daily.   omeprazole 20 MG capsule Commonly known as:  PRILOSEC Take 20 mg by mouth daily.   oxybutynin 15 MG 24 hr tablet Commonly known as:  DITROPAN XL Take 1 tablet (15 mg total) by mouth daily.   pantoprazole 40 MG tablet Commonly known as:  PROTONIX Take 1 tablet (40 mg total) by mouth daily.   sitaGLIPtin 50 MG tablet Commonly known as:  JANUVIA Take 50 mg by mouth daily.       Vitals:   12/28/17 1054 12/28/17 1225  BP: (!) 172/65 (!) 159/56  Pulse: 81 72  Resp: 18 18  Temp:    SpO2: 98% 97%    Tele box removed and returned. Skin clean, dry and intact without evidence of skin break down, no evidence of skin tears noted. IV catheter discontinued intact. Site without signs and symptoms of complications. Dressing and pressure applied. Pt denies pain at this time. No complaints noted.  An After Visit Summary was printed and given to the patient. Patient escorted via [place], and D/C home via private auto.  Rolley Sims

## 2017-12-28 NOTE — Discharge Summary (Signed)
[place] at [facility], 84 y.o., DOB 1929/11/14, MRN [id number]. Admission date: 12/27/2017 Discharge Date 12/28/2017 Primary MD Tracie Harrier, MD Admitting Physician Henreitta Leber, MD  Admission Diagnosis  Unstable angina [facility]) [I20.0]  Discharge Diagnosis   Active Problems: Chest pain atypical in nature status post stress test Accelerated hypertension Diabetes type 2 Essential hypertension History of gout GERD History of urinary incontinence History of rheumatoid arthritis  Hospital Course Patient is 84 year old presented with chest pain and diaphoresis.  She was seen in the emergency room and admitted for the symptoms.  Cardiac enzymes EKG was nonrevealing.  Patient underwent a stress test which was normal.  She was seen by cardiology who felt her pain was atypical.  Patient treated with PPIs.  Also started on [place] she will follow-up with her primary cardiologist to decide if any further work-up needs to be done.  She is currently asymptomatic and chest pain-free            Consults  cardiology  Significant Tests:  See full reports for all details    Dg Chest 2 View  Result Date: 12/27/2017 CLINICAL DATA:  Chest pain started last night. EXAM: CHEST - 2 VIEW COMPARISON:  10/06/2014 FINDINGS: The heart size and mediastinal contours are within normal limits. Both lungs are clear. The visualized skeletal structures are unremarkable. IMPRESSION: No active cardiopulmonary disease. Electronically Signed   By: Kathreen Devoid   On: 12/27/2017 13:59   Nm Myocar Multi W/spect W/wall Motion / Ef  Result Date: 12/28/2017  Blood pressure demonstrated a normal response to exercise.  There was no ST segment deviation noted during stress.  The study is normal.  This is a low risk study.  The left ventricular ejection fraction is mildly decreased (45-54%).        Today   Subjective:   Dana Green patient currently denies  any chest pain Objective:   Blood pressure (!) 159/56, pulse 72, temperature 98.1 F (36.7 C), temperature source Oral, resp. rate 18, height 5\' 4"  (1.626 m), weight 77.1 kg, SpO2 97 %.  .  Intake/Output Summary (Last 24 hours) at 12/28/2017 1521 Last data filed at 12/28/2017 1248 Gross per 24 hour  Intake 146.47 ml  Output 100 ml  Net 46.47 ml    Exam VITAL SIGNS: Blood pressure (!) 159/56, pulse 72, temperature 98.1 F (36.7 C), temperature source Oral, resp. rate 18, height 5\' 4"  (1.626 m), weight 77.1 kg, SpO2 97 %.  GENERAL:  84 y.o.-year-old patient lying in the bed with no acute distress.  EYES: Pupils equal, round, reactive to light and accommodation. No scleral icterus. Extraocular muscles intact.  HEENT: Head atraumatic, normocephalic. Oropharynx and nasopharynx clear.  NECK:  Supple, no jugular venous distention. No thyroid enlargement, no tenderness.  LUNGS: Normal breath sounds bilaterally, no wheezing, rales,rhonchi or crepitation. No use of accessory muscles of respiration.  CARDIOVASCULAR: S1, S2 normal. No murmurs, rubs, or gallops.  ABDOMEN: Soft, nontender, nondistended. Bowel sounds present. No organomegaly or mass.  EXTREMITIES: No pedal edema, cyanosis, or clubbing.  NEUROLOGIC: Cranial nerves II through XII are intact. Muscle strength 5/5 in all extremities. Sensation intact. Gait not checked.  PSYCHIATRIC: The patient is alert and oriented x 3.  SKIN: No obvious rash, lesion, or ulcer.   Data Review     CBC w Diff:  Lab Results  Component Value Date   WBC 7.9 12/28/2017   HGB 13.0 12/28/2017   HGB  9.3 (L) 08/02/2011   HCT 38.6 12/28/2017   HCT 27.7 (L) 08/02/2011   PLT 236 12/28/2017   PLT 192 08/02/2011   LYMPHOPCT 19.2 08/02/2011   MONOPCT 9.4 08/02/2011   EOSPCT 0.6 08/02/2011   BASOPCT 0.3 08/02/2011   CMP:  Lab Results  Component Value Date   NA 138 12/27/2017   NA 138 07/31/2011   K 4.3 12/27/2017   K 3.6 08/01/2011   CL 106  12/27/2017   CL 102 07/31/2011   CO2 20 (L) 12/27/2017   CO2 23 07/31/2011   BUN 24 (H) 12/27/2017   BUN 7 07/31/2011   CREATININE 0.89 12/27/2017   CREATININE 0.69 07/31/2011  .  Micro Results No results found for this or any previous visit (from the past 240 hour(s)).      Code Status Orders  (From admission, onward)         Start     Ordered   12/27/17 1614  Full code  Continuous     12/27/17 1613        Code Status History    This patient has a current code status but no historical code status.          Follow-up Information    Tracie Harrier, MD On 01/05/2018.   Specialty:  Internal Medicine Why:  hosp f/u, Appointment Time: @ 2:15pm Contact information: [address] [place] [address] [phone]        Teodoro Spray, MD On 01/04/2018.   Specialty:  Cardiology Why:  chest pain hosp f/u, Appointment Time:10:30am Contact information: [place] [address] [phone]           Discharge Medications   Allergies as of 12/28/2017      Reactions   Aspirin Other (See Comments)   Pt states she has metal clips and was told not to take asa   Butalbital-apap-caffeine Other (See Comments)   Ciprofloxacin Other (See Comments)   Diazepam Other (See Comments)   Ibuprofen Other (See Comments)   Lovastatin Other (See Comments)   Other reaction(s): Unknown   Other    Other reaction(s): Unknown   Propoxyphene Other (See Comments)   Statins    Other reaction(s): Muscle Pain Other reaction(s): Muscle Pain   Sulfa Antibiotics Other (See Comments)   Tapentadol Diarrhea      Medication List    STOP taking these medications   conjugated estrogens vaginal cream Commonly known as:  PREMARIN   estradiol 0.1 MG/GM vaginal cream Commonly known as:  ESTRACE   ranitidine 150 MG capsule Commonly known as:  ZANTAC     TAKE these medications   allopurinol 100 MG tablet Commonly known as:   ZYLOPRIM Take 100 mg by mouth 2 (two) times daily.   amLODipine 5 MG tablet Commonly known as:  NORVASC Take 5 mg by mouth daily.   colestipol 1 g tablet Commonly known as:  COLESTID Take 1 g by mouth daily as needed.   doxycycline 100 MG EC tablet Commonly known as:  DORYX Take 100 mg by mouth 2 (two) times daily.   glipiZIDE 10 MG tablet Commonly known as:  GLUCOTROL Take 10 mg by mouth 2 (two) times daily before a meal.   isosorbide mononitrate 30 MG 24 hr tablet Commonly known as:  IMDUR Take 1 tablet (30 mg total) by mouth daily.   leflunomide 20 MG tablet Commonly known as:  ARAVA Take 20 mg by mouth daily.  loratadine 10 MG tablet Commonly known as:  CLARITIN Take 10 mg by mouth daily.   metFORMIN 1000 MG tablet Commonly known as:  GLUCOPHAGE TAKE ONE TABLET BY MOUTH TWICE DAILY   metoprolol succinate 50 MG 24 hr tablet Commonly known as:  TOPROL-XL Take 50 mg by mouth daily.   OCUVITE ADULT FORMULA Caps Take 1 capsule by mouth daily.   omeprazole 20 MG capsule Commonly known as:  PRILOSEC Take 20 mg by mouth daily.   oxybutynin 15 MG 24 hr tablet Commonly known as:  DITROPAN XL Take 1 tablet (15 mg total) by mouth daily.   pantoprazole 40 MG tablet Commonly known as:  PROTONIX Take 1 tablet (40 mg total) by mouth daily.   sitaGLIPtin 50 MG tablet Commonly known as:  JANUVIA Take 50 mg by mouth daily.          Total Time in preparing paper work, data evaluation and todays exam - 59 minutes  Dustin Flock M.D on 12/28/2017 at [facility]  [phone]

## 2017-12-28 NOTE — Consult Note (Signed)
[facility] Consultation Note  Patient ID: Dana Green, MRN: [id number], DOB/AGE: October 20, 1929 84 y.o. Admit date: 12/27/2017   Date of Consult: 12/28/2017 Primary Physician: Tracie Harrier, MD Primary Cardiologist: Ubaldo Glassing  Chief Complaint:  Chief Complaint  Patient presents with  . Chest Pain   Reason for Consult:'Chest pain  HPI: 84 y.o. female with known hyperlipidemia hypertension diabetes and chronic abnormal EKG showing left bundle branch block since 1995 with a cardiac catheterization in 1995 showing normal coronary arteries.  Since then she has been on appropriate prevention measures including metoprolol amlodipine for hypertension control and mild LV systolic dysfunction as well as treatment for diabetes.  She has been physically active with no evidence of significant issues until she had new onset substernal chest discomfort severe in nature stabbing into the back and the right side of the chest.  This lasted for several hours and she was seen in the emergency room with unchanged EKG and normal troponin.  There was not any further chest discomfort although she did have some improvements with nitroglycerin.  She was doing over night well until she had a Lexiscan infusion stress test for which she had chest discomfort with Lexiscan infusion which completely resolved after wearing off of the medication.  There was no evidence of shortness of breath or other EKG changes.  The patient has had normal perfusion with her stress test suggesting a low risk.  Echocardiogram has shown mild LV systolic dysfunction with septal dyssynchrony with ejection fraction of 45% consistent with old left bundle branch block and no other significant valvular heart disease.  Currently the patient is stable  Past Medical History:  Diagnosis Date  . Arthritis   . Chronic diarrhea   . Diabetes mellitus without complication ([place])   . Gout   . Hypercholesteremia   . Hypertension   . Spastic  colon       Surgical History:  Past Surgical History:  Procedure Laterality Date  . ABDOMINAL HYSTERECTOMY    . APPENDECTOMY    . BRAIN SURGERY     Mass Removal; Anuerysm Repair  . BREAST BIOPSY Left    neg  . CHOLECYSTECTOMY    . Floating Kidney Repair    . REPLACEMENT TOTAL KNEE Left 2000     Home Meds: Prior to Admission medications   Medication Sig Start Date End Date Taking? Authorizing Provider  allopurinol (ZYLOPRIM) 100 MG tablet Take 100 mg by mouth 2 (two) times daily.    Yes [provider]  amLODipine (NORVASC) 5 MG tablet Take 5 mg by mouth daily.    Yes [provider]  doxycycline (DORYX) 100 MG EC tablet Take 100 mg by mouth 2 (two) times daily.   Yes [provider]  glipiZIDE (GLUCOTROL) 10 MG tablet Take 10 mg by mouth 2 (two) times daily before a meal.  11/26/15  Yes [provider]  leflunomide (ARAVA) 20 MG tablet Take 20 mg by mouth daily.   Yes [provider]  loratadine (CLARITIN) 10 MG tablet Take 10 mg by mouth daily.    Yes [provider]  metFORMIN (GLUCOPHAGE) 1000 MG tablet TAKE ONE TABLET BY MOUTH TWICE DAILY 12/07/14  Yes [provider]  metoprolol succinate (TOPROL-XL) 50 MG 24 hr tablet Take 50 mg by mouth daily.    Yes [provider]  Multiple Vitamins-Minerals (OCUVITE ADULT FORMULA) CAPS Take 1 capsule by mouth daily.    Yes [provider]  omeprazole (PRILOSEC) 20 MG  capsule Take 20 mg by mouth daily.  02/28/15  Yes [provider]  oxybutynin (DITROPAN XL) 15 MG 24 hr tablet Take 1 tablet (15 mg total) by mouth daily. 05/29/16  Yes McGowan, Larene Beach A, PA-C  sitaGLIPtin (JANUVIA) 50 MG tablet Take 50 mg by mouth daily.   Yes [provider]  colestipol (COLESTID) 1 g tablet Take 1 g by mouth daily as needed.  05/30/15   [provider]  conjugated estrogens (PREMARIN) vaginal cream Place 1 Applicatorful vaginally daily. Apply 0.5mg   (pea-sized amount)  just inside the vaginal introitus with a finger-tip every night for two weeks and then Monday, Wednesday and Friday nights. Patient not taking: Reported on 11/11/2016 05/29/16   Zara Council A, PA-C  estradiol (ESTRACE VAGINAL) 0.1 MG/GM vaginal cream Apply 0.5mg  (pea-sized amount)  just inside the vaginal introitus with a finger-tip every night for two weeks and then Monday, Wednesday and Friday nights. Patient not taking: Reported on 11/11/2016 05/29/16   Zara Council A, PA-C  ranitidine (ZANTAC) 150 MG capsule Take 1 capsule (150 mg total) by mouth 2 (two) times daily. Patient not taking: Reported on 05/29/2016 10/08/14   Carrie Mew, MD    Inpatient Medications:  . allopurinol  100 mg Oral BID  . amLODipine  5 mg Oral Daily  . insulin aspart  0-5 Units Subcutaneous QHS  . insulin aspart  0-9 Units Subcutaneous TID WC  . leflunomide  20 mg Oral Daily  . loratadine  10 mg Oral Daily  . metoprolol succinate  50 mg Oral Daily  . multivitamin-lutein  1 capsule Oral Daily  . oxybutynin  15 mg Oral Daily  . pantoprazole  40 mg Oral Daily   . heparin 950 Units/hr (12/27/17 1535)    Allergies:  Allergies  Allergen Reactions  . Aspirin Other (See Comments)    Pt states she has metal clips and was told not to take asa  . Butalbital-Apap-Caffeine Other (See Comments)  . Ciprofloxacin Other (See Comments)  . Diazepam Other (See Comments)  . Ibuprofen Other (See Comments)  . Lovastatin Other (See Comments)    Other reaction(s): Unknown  . Other     Other reaction(s): Unknown  . Propoxyphene Other (See Comments)  . Statins     Other reaction(s): Muscle Pain Other reaction(s): Muscle Pain  . Sulfa Antibiotics Other (See Comments)  . Tapentadol Diarrhea    Social History   Socioeconomic History  . Marital status: Married    Spouse name: Not on file  . Number of children: Not on file  . Years of education: Not on file  . Highest education level: Not on  file  Occupational History  . Not on file  Social Needs  . Financial resource strain: Not on file  . Food insecurity:    Worry: Not on file    Inability: Not on file  . Transportation needs:    Medical: Not on file    Non-medical: Not on file  Tobacco Use  . Smoking status: Never Smoker  . Smokeless tobacco: Never Used  Substance and Sexual Activity  . Alcohol use: No  . Drug use: No  . Sexual activity: Not on file  Lifestyle  . Physical activity:    Days per week: Not on file    Minutes per session: Not on file  . Stress: Not on file  Relationships  . Social connections:    Talks on phone: Not on file    Gets together: Not  on file    Attends religious service: Not on file    Active member of club or organization: Not on file    Attends meetings of clubs or organizations: Not on file    Relationship status: Not on file  . Intimate partner violence:    Fear of current or ex partner: Not on file    Emotionally abused: Not on file    Physically abused: Not on file    Forced sexual activity: Not on file  Other Topics Concern  . Not on file  Social History Narrative  . Not on file     Family History  Problem Relation Age of Onset  . Prostate cancer Father   . Bladder Cancer Father   . Prostate cancer Brother   . Kidney disease Neg Hx      Review of Systems Positive for wrist pain Negative for: General:  chills, fever, night sweats or weight changes.  Cardiovascular: PND orthopnea syncope dizziness  Dermatological skin lesions rashes Respiratory: Cough congestion Urologic: Frequent urination urination at night and hematuria Abdominal: negative for nausea, vomiting, diarrhea, bright red blood per rectum, melena, or hematemesis Neurologic: negative for visual changes, and/or hearing changes  All other systems reviewed and are otherwise negative except as noted above.  Labs: Recent Labs    12/27/17 1345 12/27/17 1627 12/27/17 1953 12/28/17 0030  TROPONINI  <0.03 <0.03 <0.03 <0.03   Lab Results  Component Value Date   WBC 7.9 12/28/2017   HGB 13.0 12/28/2017   HCT 38.6 12/28/2017   MCV 88.0 12/28/2017   PLT 236 12/28/2017    Recent Labs  Lab 12/27/17 1345  NA 138  K 4.3  CL 106  CO2 20*  BUN 24*  CREATININE 0.89  CALCIUM 8.8*  GLUCOSE 139*   No results found for: CHOL, HDL, LDLCALC, TRIG No results found for: DDIMER  Radiology/Studies:  Dg Chest 2 View  Result Date: 12/27/2017 CLINICAL DATA:  Chest pain started last night. EXAM: CHEST - 2 VIEW COMPARISON:  10/06/2014 FINDINGS: The heart size and mediastinal contours are within normal limits. Both lungs are clear. The visualized skeletal structures are unremarkable. IMPRESSION: No active cardiopulmonary disease. Electronically Signed   By: Kathreen Devoid   On: 12/27/2017 13:59   Nm Myocar Multi W/spect W/wall Motion / Ef  Result Date: 12/28/2017  Blood pressure demonstrated a normal response to exercise.  There was no ST segment deviation noted during stress.  The study is normal.  This is a low risk study.  The left ventricular ejection fraction is mildly decreased (45-54%).     EKG: Normal sinus rhythm with left bundle branch block  Weights: Filed Weights   12/27/17 1335  Weight: 77.1 kg     Physical Exam: Blood pressure (!) 159/56, pulse 72, temperature 98.1 F (36.7 C), temperature source Oral, resp. rate 18, height 5\' 4"  (1.626 m), weight 77.1 kg, SpO2 97 %. Body mass index is 29.18 kg/m. General: Well developed, well nourished, in no acute distress. Head eyes ears nose throat: Normocephalic, atraumatic, sclera non-icteric, no xanthomas, nares are without discharge. No apparent thyromegaly and/or mass  Lungs: Normal respiratory effort.  no wheezes, no rales, no rhonchi.  Heart: RRR with normal S1 S2. no murmur gallop, no rub, PMI is normal size and placement, carotid upstroke normal without bruit, jugular venous pressure is normal Abdomen: Soft, non-tender,  non-distended with normoactive bowel sounds. No hepatomegaly. No rebound/guarding. No obvious abdominal masses. Abdominal aorta is normal size without  bruit Extremities: No edema. no cyanosis, no clubbing, no ulcers  Peripheral : 2+ bilateral upper extremity pulses, 2+ bilateral femoral pulses, 2+ bilateral dorsal pedal pulse Neuro: Alert and oriented. No facial asymmetry. No focal deficit. Moves all extremities spontaneously. Musculoskeletal: Normal muscle tone without kyphosis Psych:  Responds to questions appropriately with a normal affect.    Assessment: 84 year old female with essential hypertension mixed hyperlipidemia diabetes and old EKG changes of left bundle branch block with new onset atypical chest discomfort of the right side without evidence of myocardial infarction with a normal stress perfusion and mild LV dysfunction consistent with previous dyssynchrony from left bundle branch block  Plan: 1.  Continue amlodipine metoprolol for hypertension control and anginal symptoms 2.  Again ambulation and follow for improvements of symptoms and possible existence of anginal symptoms with more ambulation requiring further intervention 3.  Other isosorbide for atypical chest discomfort despite normal myocardial perfusion 4.  No additional diagnostic testing at this time and if patient is improved with current symptoms would possible discharge to home with follow-up next week for further adjustments of medication management and further address of symptoms Signed, Corey Skains M.D. [facility] 12/28/2017, 12:38 PM

## 2017-12-28 NOTE — Plan of Care (Signed)
  Problem: Activity: Goal: Risk for activity intolerance will decrease Outcome: Progressing   Problem: Nutrition: Goal: Adequate nutrition will be maintained Outcome: Progressing   Problem: Coping: Goal: Level of anxiety will decrease Outcome: Progressing   Problem: Pain Managment: Goal: General experience of comfort will improve Outcome: Progressing   Problem: Safety: Goal: Ability to remain free from injury will improve Outcome: Progressing   Problem: Skin Integrity: Goal: Risk for impaired skin integrity will decrease Outcome: Progressing   Problem: Education: Goal: Knowledge of General Education information will improve Description Including pain rating scale, medication(s)/side effects and non-pharmacologic comfort measures Outcome: Completed/Met

## 2017-12-28 NOTE — Progress Notes (Signed)
[facility] for heparin Indication: chest pain/ACS  Allergies  Allergen Reactions  . Aspirin Other (See Comments)    Pt states she has metal clips and was told not to take asa  . Butalbital-Apap-Caffeine Other (See Comments)  . Ciprofloxacin Other (See Comments)  . Diazepam Other (See Comments)  . Ibuprofen Other (See Comments)  . Lovastatin Other (See Comments)    Other reaction(s): Unknown  . Other     Other reaction(s): Unknown  . Propoxyphene Other (See Comments)  . Statins     Other reaction(s): Muscle Pain Other reaction(s): Muscle Pain  . Sulfa Antibiotics Other (See Comments)  . Tapentadol Diarrhea    Patient Measurements: Height: 5\' 4"  (162.6 cm) Weight: 170 lb (77.1 kg) IBW/kg (Calculated) : 54.7 Heparin Dosing Weight: 71kg  Vital Signs: Temp: 98.1 F (36.7 C) (08/12 0750) Temp Source: Oral (08/12 0750) BP: 158/59 (08/12 0750) Pulse Rate: 62 (08/12 0750)  Labs: Recent Labs    12/27/17 1345 12/27/17 1627 12/27/17 1953 12/28/17 0030 12/28/17 0750  HGB 14.0  --   --   --  13.0  HCT 40.0  --   --   --  38.6  PLT 290  --   --   --  236  APTT 31  --   --   --   --   LABPROT 13.3  --   --   --   --   INR 1.02  --   --   --   --   HEPARINUNFRC  --   --   --  0.36 0.37  CREATININE 0.89  --   --   --   --   TROPONINI <0.03 <0.03 <0.03 <0.03  --     Estimated Creatinine Clearance: 45.6 mL/min (by C-G formula based on SCr of 0.89 mg/dL).   Medical History: Past Medical History:  Diagnosis Date  . Arthritis   . Chronic diarrhea   . Diabetes mellitus without complication ([place])   . Gout   . Hypercholesteremia   . Hypertension   . Spastic colon     Assessment: 84 y.o. female who presents for evaluation of acute onset and severe chest pain. Pertinent PMH includes DM and AV malformation or brain aneurism 30 years ago. She is on no anticoagulants PTA. Baseline labs have been ordered  Heparin infusing at 950  units/hr, 8/12 0800 Heparin level of 0.37 is within therapeutic range. This is the second therapeutic level in a row.  Goal of Therapy:  Heparin level 0.3-0.7 units/ml Monitor platelets by anticoagulation protocol: Yes   Plan:  Will continue with current rate of 950 units/hr. Will check next Heparin level with AM labs. Daily CBC while on Heparin drip.  Paulina Fusi, PharmD, BCPS 12/28/2017 9:11 AM

## 2017-12-28 NOTE — Progress Notes (Signed)
*  PRELIMINARY RESULTS* Echocardiogram 2D Echocardiogram has been performed.  Dana Green 12/28/2017, 11:58 AM

## 2018-05-07 DIAGNOSIS — R413 Other amnesia: Secondary | ICD-10-CM | POA: Insufficient documentation

## 2019-01-03 DIAGNOSIS — R1312 Dysphagia, oropharyngeal phase: Secondary | ICD-10-CM | POA: Insufficient documentation

## 2019-01-05 ENCOUNTER — Other Ambulatory Visit: Payer: Self-pay | Admitting: Nurse Practitioner

## 2019-01-05 ENCOUNTER — Other Ambulatory Visit (HOSPITAL_COMMUNITY): Payer: Self-pay | Admitting: Nurse Practitioner

## 2019-01-05 DIAGNOSIS — K219 Gastro-esophageal reflux disease without esophagitis: Secondary | ICD-10-CM

## 2019-01-05 DIAGNOSIS — R1310 Dysphagia, unspecified: Secondary | ICD-10-CM

## 2019-01-07 ENCOUNTER — Ambulatory Visit: Payer: Medicare Other

## 2019-01-07 DIAGNOSIS — R32 Unspecified urinary incontinence: Secondary | ICD-10-CM | POA: Insufficient documentation

## 2019-01-07 DIAGNOSIS — F411 Generalized anxiety disorder: Secondary | ICD-10-CM | POA: Insufficient documentation

## 2019-01-10 ENCOUNTER — Other Ambulatory Visit: Payer: Self-pay

## 2019-01-10 ENCOUNTER — Ambulatory Visit
Admission: RE | Admit: 2019-01-10 | Discharge: 2019-01-10 | Disposition: A | Discharge status: Home / Self Care | Payer: Medicare Other | Source: Ambulatory Visit | Attending: Nurse Practitioner | Admitting: Nurse Practitioner

## 2019-01-10 DIAGNOSIS — R1310 Dysphagia, unspecified: Secondary | ICD-10-CM | POA: Diagnosis present

## 2019-01-10 DIAGNOSIS — K219 Gastro-esophageal reflux disease without esophagitis: Secondary | ICD-10-CM | POA: Insufficient documentation

## 2019-09-13 DIAGNOSIS — E1142 Type 2 diabetes mellitus with diabetic polyneuropathy: Secondary | ICD-10-CM | POA: Insufficient documentation

## 2020-07-03 IMAGING — RF ESOPHAGUS/BARIUM SWALLOW/TABLET STUDY
10 of 15 series · 14 of 24 positions shown · non-contrast
Comparison: CT chest 01/31/2013

CLINICAL DATA: Dysphagia

EXAM:
ESOPHOGRAM / BARIUM SWALLOW / BARIUM TABLET STUDY
TECHNIQUE: Combined double contrast and single contrast examination performed
using effervescent crystals, thick barium liquid, and thin barium
liquid. The patient was observed with fluoroscopy swallowing a 13 mm
barium sulphate tablet.
FLUOROSCOPY TIME:  Fluoroscopy Time:  1 minutes 42 seconds
Radiation Exposure Index (if provided by the fluoroscopic device):
20.7 mGy
Number of Acquired Spot Images: 29

[Series 1: fluoro_barium 2fps_bw · 0.19mm/px · 1 of 1 slices shown (1 of 3)]
[im 1/1]
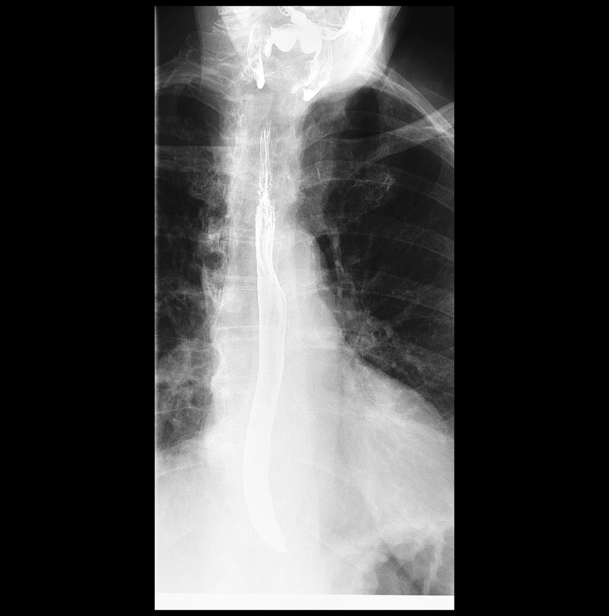

[Series 4: fluoro_barium 2fps_bw · 0.19mm/px · 1 of 1 slices shown (2 of 3)]
[im 1/1]
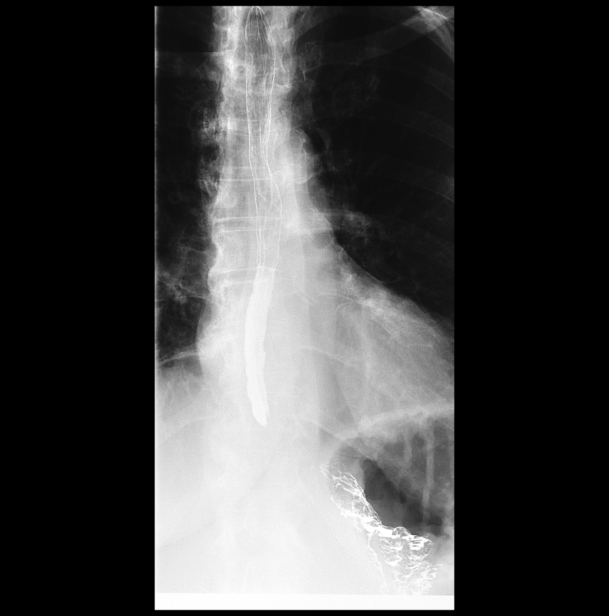

[Series 5: cp_standard · 0.29mm/px · 1 of 35 frames shown (1 of 7)]
[frame 30/35]
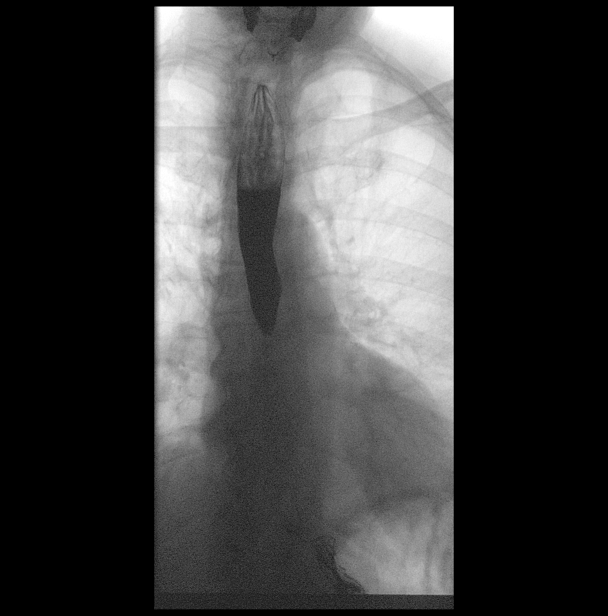

[Series 8: fluoro_barium 2fps_bw · 0.19mm/px · 1 of 1 slices shown (3 of 3)]
[im 1/1]
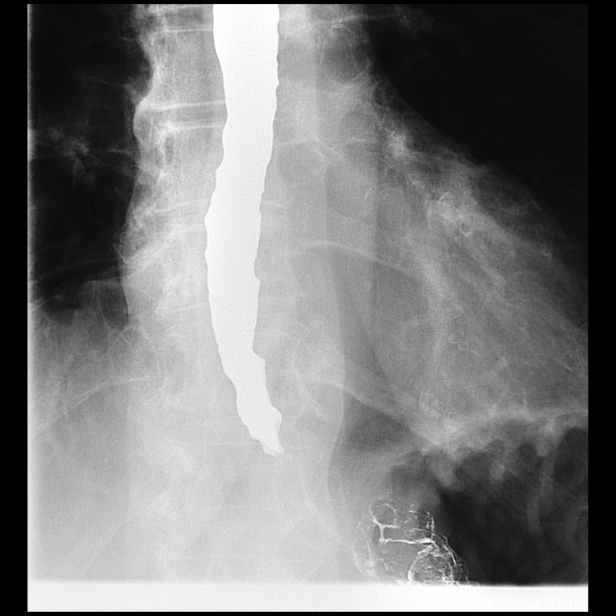

[Series 10: cp_standard · 0.19mm/px · 2 of 33 frames shown (2 of 7)]
[frame 5/33]
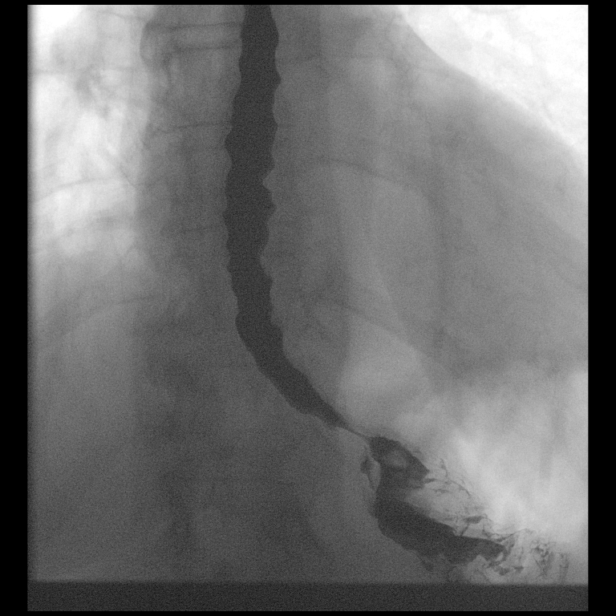
[frame 33/33]
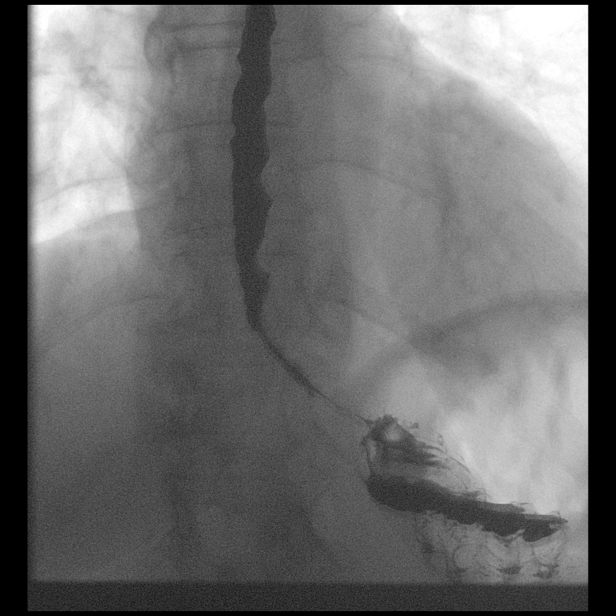

[Series 12: cp_standard · 0.19mm/px · 2 of 28 frames shown (3 of 7)]
[frame 15/28]
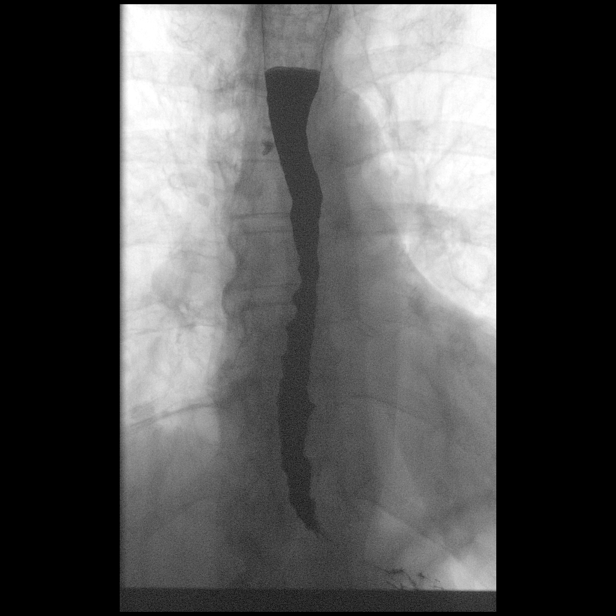
[frame 24/28]
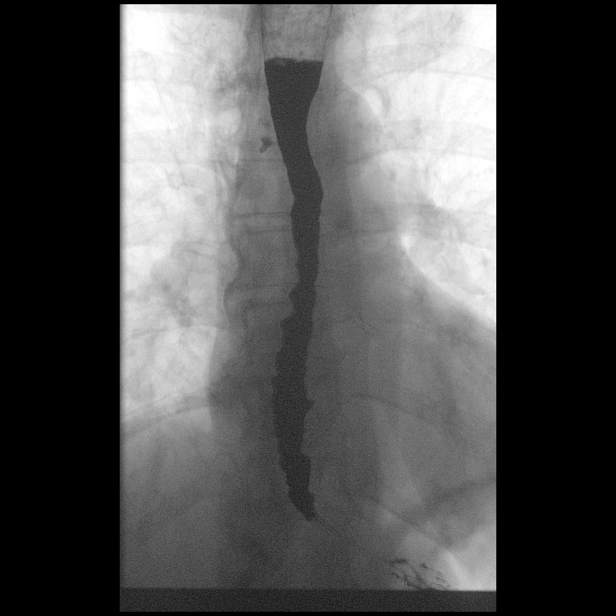

[Series 15: cp_standard · 0.19mm/px · 2 of 29 frames shown (4 of 7)]
[frame 1/29]
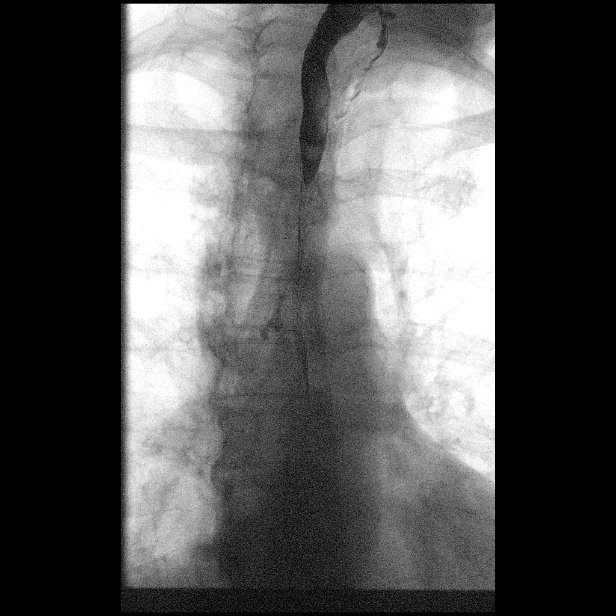
[frame 25/29]
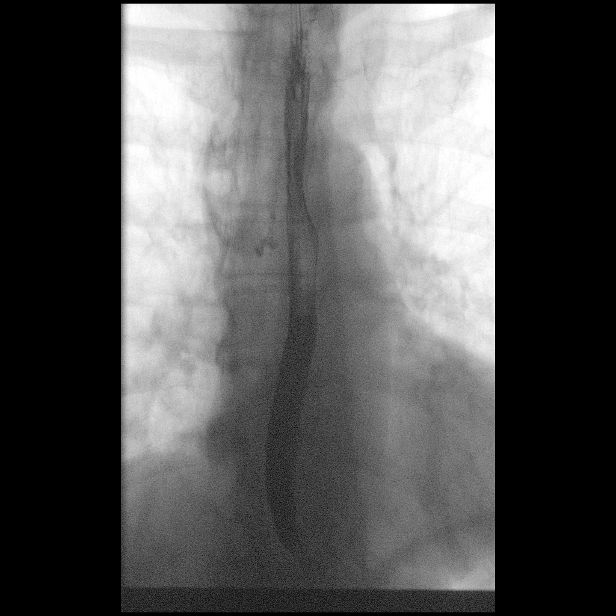

[Series 17: cp_standard · 0.19mm/px · 2 of 10 frames shown (5 of 7)]
[frame 7/10]
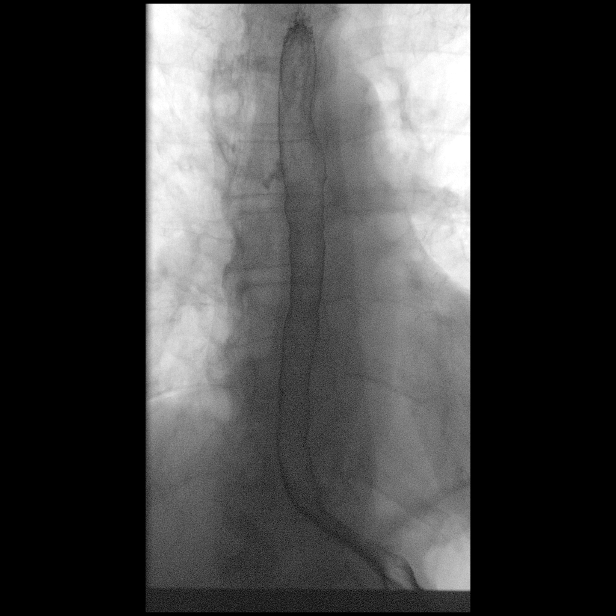
[frame 9/10]
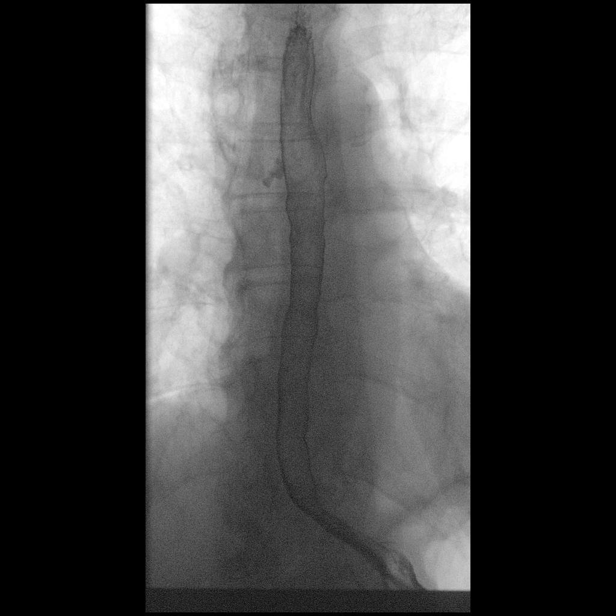

[Series 21: cp_standard · 0.18mm/px · 1 of 1 slices shown (6 of 7)]
[im 1/1]
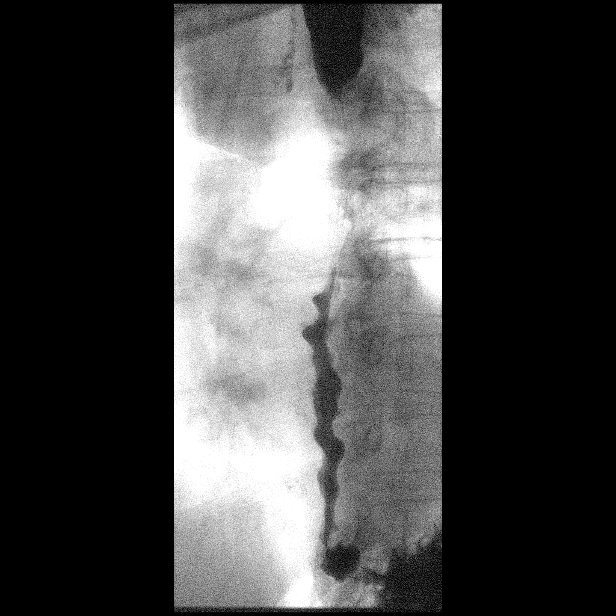

[Series 24: cp_standard · 0.30mm/px · 1 of 1 slices shown (7 of 7)]
[im 1/1]
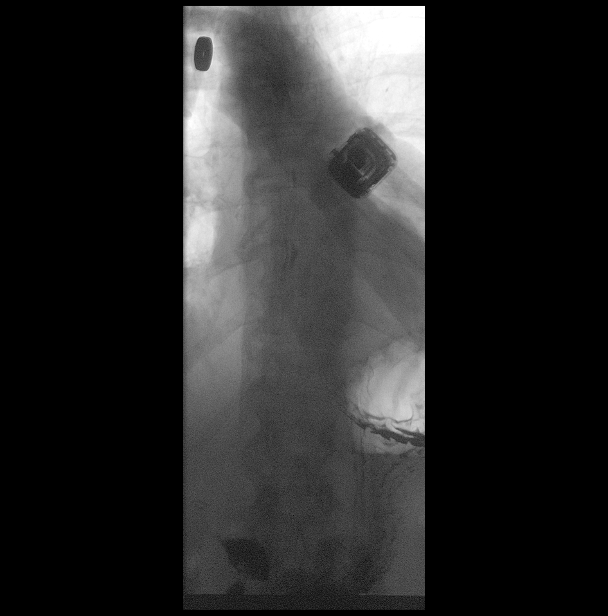

[14 of 24 positions shown; findings below may reference images not displayed]

FINDINGS: There is esophageal dysmotility with prominent tertiary
contractions. Significant delay of the barium column within the mid
to distal esophagus with both thick and thin barium. There is no
evidence of persisting stricture. No discrete mass or ulceration.
Rapid sequence imaging of the esophagus in the AP and lateral
projections demonstrates no discrete mucosal abnormalities. Small
sliding-type hiatal hernia.

No significant gastroesophageal reflux was elicited. At the
conclusion of the study, a 13 mm barium tablet was administered.
This passed without delay into the stomach.
IMPRESSION: 1. Esophageal dysmotility.
2. No stricture.
3. Small hiatal hernia.

## 2020-09-06 DIAGNOSIS — M0609 Rheumatoid arthritis without rheumatoid factor, multiple sites: Secondary | ICD-10-CM | POA: Insufficient documentation

## 2022-03-26 DIAGNOSIS — C4492 Squamous cell carcinoma of skin, unspecified: Secondary | ICD-10-CM | POA: Insufficient documentation

## 2022-05-24 ENCOUNTER — Inpatient Hospital Stay
Admission: EM | Admit: 2022-05-24 | Discharge: 2022-05-27 | DRG: 280 | Disposition: A | Discharge status: Home / Self Care | Payer: Medicare Other | Attending: Internal Medicine | Admitting: Internal Medicine

## 2022-05-24 ENCOUNTER — Other Ambulatory Visit: Payer: Self-pay

## 2022-05-24 ENCOUNTER — Emergency Department: Payer: Medicare Other

## 2022-05-24 DIAGNOSIS — Z1152 Encounter for screening for COVID-19: Secondary | ICD-10-CM

## 2022-05-24 DIAGNOSIS — Z8042 Family history of malignant neoplasm of prostate: Secondary | ICD-10-CM | POA: Diagnosis not present

## 2022-05-24 DIAGNOSIS — M109 Gout, unspecified: Secondary | ICD-10-CM | POA: Diagnosis present

## 2022-05-24 DIAGNOSIS — I447 Left bundle-branch block, unspecified: Secondary | ICD-10-CM | POA: Diagnosis present

## 2022-05-24 DIAGNOSIS — E1142 Type 2 diabetes mellitus with diabetic polyneuropathy: Secondary | ICD-10-CM | POA: Diagnosis present

## 2022-05-24 DIAGNOSIS — D631 Anemia in chronic kidney disease: Secondary | ICD-10-CM | POA: Diagnosis present

## 2022-05-24 DIAGNOSIS — M069 Rheumatoid arthritis, unspecified: Secondary | ICD-10-CM | POA: Diagnosis present

## 2022-05-24 DIAGNOSIS — N183 Chronic kidney disease, stage 3 unspecified: Secondary | ICD-10-CM | POA: Diagnosis present

## 2022-05-24 DIAGNOSIS — Z886 Allergy status to analgesic agent status: Secondary | ICD-10-CM | POA: Diagnosis not present

## 2022-05-24 DIAGNOSIS — I25110 Atherosclerotic heart disease of native coronary artery with unstable angina pectoris: Secondary | ICD-10-CM | POA: Diagnosis present

## 2022-05-24 DIAGNOSIS — E1122 Type 2 diabetes mellitus with diabetic chronic kidney disease: Secondary | ICD-10-CM | POA: Diagnosis present

## 2022-05-24 DIAGNOSIS — K219 Gastro-esophageal reflux disease without esophagitis: Secondary | ICD-10-CM | POA: Diagnosis present

## 2022-05-24 DIAGNOSIS — J810 Acute pulmonary edema: Secondary | ICD-10-CM | POA: Diagnosis present

## 2022-05-24 DIAGNOSIS — J9601 Acute respiratory failure with hypoxia: Secondary | ICD-10-CM | POA: Diagnosis present

## 2022-05-24 DIAGNOSIS — E7800 Pure hypercholesterolemia, unspecified: Secondary | ICD-10-CM | POA: Diagnosis present

## 2022-05-24 DIAGNOSIS — M1990 Unspecified osteoarthritis, unspecified site: Secondary | ICD-10-CM | POA: Diagnosis present

## 2022-05-24 DIAGNOSIS — Z90710 Acquired absence of both cervix and uterus: Secondary | ICD-10-CM | POA: Diagnosis not present

## 2022-05-24 DIAGNOSIS — I5023 Acute on chronic systolic (congestive) heart failure: Secondary | ICD-10-CM | POA: Diagnosis present

## 2022-05-24 DIAGNOSIS — Z883 Allergy status to other anti-infective agents status: Secondary | ICD-10-CM | POA: Diagnosis not present

## 2022-05-24 DIAGNOSIS — Z96652 Presence of left artificial knee joint: Secondary | ICD-10-CM | POA: Diagnosis present

## 2022-05-24 DIAGNOSIS — Z888 Allergy status to other drugs, medicaments and biological substances status: Secondary | ICD-10-CM

## 2022-05-24 DIAGNOSIS — Z789 Other specified health status: Secondary | ICD-10-CM | POA: Insufficient documentation

## 2022-05-24 DIAGNOSIS — I10 Essential (primary) hypertension: Secondary | ICD-10-CM | POA: Diagnosis present

## 2022-05-24 DIAGNOSIS — F411 Generalized anxiety disorder: Secondary | ICD-10-CM | POA: Diagnosis present

## 2022-05-24 DIAGNOSIS — Z8052 Family history of malignant neoplasm of bladder: Secondary | ICD-10-CM | POA: Diagnosis not present

## 2022-05-24 DIAGNOSIS — I130 Hypertensive heart and chronic kidney disease with heart failure and stage 1 through stage 4 chronic kidney disease, or unspecified chronic kidney disease: Secondary | ICD-10-CM | POA: Diagnosis present

## 2022-05-24 DIAGNOSIS — Z882 Allergy status to sulfonamides status: Secondary | ICD-10-CM

## 2022-05-24 DIAGNOSIS — I214 Non-ST elevation (NSTEMI) myocardial infarction: Secondary | ICD-10-CM | POA: Diagnosis present

## 2022-05-24 DIAGNOSIS — I200 Unstable angina: Secondary | ICD-10-CM | POA: Diagnosis present

## 2022-05-24 DIAGNOSIS — E119 Type 2 diabetes mellitus without complications: Secondary | ICD-10-CM

## 2022-05-24 DIAGNOSIS — I509 Heart failure, unspecified: Secondary | ICD-10-CM

## 2022-05-24 LAB — COMPREHENSIVE METABOLIC PANEL
ALT: 12 U/L (ref 0–44)
AST: 21 U/L (ref 15–41)
Albumin: 3.5 g/dL (ref 3.5–5.0)
Alkaline Phosphatase: 49 U/L (ref 38–126)
Anion gap: 10 (ref 5–15)
BUN: 21 mg/dL (ref 8–23)
CO2: 22 mmol/L (ref 22–32)
Calcium: 8 mg/dL — ABNORMAL LOW (ref 8.9–10.3)
Chloride: 109 mmol/L (ref 98–111)
Creatinine, Ser: 0.82 mg/dL (ref 0.44–1.00)
GFR, Estimated: >60 mL/min (ref >60)
Glucose, Bld: 229 mg/dL — ABNORMAL HIGH (ref 70–99)
Potassium: 3.7 mmol/L (ref 3.5–5.1)
Sodium: 141 mmol/L (ref 135–145)
Total Bilirubin: 1 mg/dL (ref 0.3–1.2)
Total Protein: 6.3 g/dL — ABNORMAL LOW (ref 6.5–8.1)

## 2022-05-24 LAB — CBC WITH DIFFERENTIAL/PLATELET
Abs Immature Granulocytes: 0.01 10*3/uL (ref 0.00–0.07)
Basophils Absolute: 0.1 10*3/uL (ref 0.0–0.1)
Basophils Relative: 1 %
Eosinophils Absolute: 0.1 10*3/uL (ref 0.0–0.5)
Eosinophils Relative: 1 %
HCT: 37.7 % (ref 36.0–46.0)
Hemoglobin: 11.8 g/dL — ABNORMAL LOW (ref 12.0–15.0)
Immature Granulocytes: 0 %
Lymphocytes Relative: 22 %
Lymphs Abs: 1.3 10*3/uL (ref 0.7–4.0)
MCH: 28 pg (ref 26.0–34.0)
MCHC: 31.3 g/dL (ref 30.0–36.0)
MCV: 89.3 fL (ref 80.0–100.0)
Monocytes Absolute: 0.5 10*3/uL (ref 0.1–1.0)
Monocytes Relative: 8 %
Neutro Abs: 4 10*3/uL (ref 1.7–7.7)
Neutrophils Relative %: 68 %
Platelets: 173 10*3/uL (ref 150–400)
RBC: 4.22 MIL/uL (ref 3.87–5.11)
RDW: 15.7 % — ABNORMAL HIGH (ref 11.5–15.5)
WBC: 5.9 10*3/uL (ref 4.0–10.5)
nRBC: 0 % (ref 0.0–0.2)

## 2022-05-24 LAB — BLOOD GAS, VENOUS
Acid-base deficit: 1.5 mmol/L (ref 0.0–2.0)
Bicarbonate: 24.8 mmol/L (ref 20.0–28.0)
O2 Saturation: 46.9 %
Patient temperature: 37
pCO2, Ven: 47 mmHg (ref 44–60)
pH, Ven: 7.33 (ref 7.25–7.43)
pO2, Ven: 40 mmHg (ref 32–45)

## 2022-05-24 LAB — RESP PANEL BY RT-PCR (RSV, FLU A&B, COVID)  RVPGX2
Influenza A by PCR: NEGATIVE
Influenza B by PCR: NEGATIVE
Resp Syncytial Virus by PCR: NEGATIVE
SARS Coronavirus 2 by RT PCR: NEGATIVE

## 2022-05-24 LAB — TROPONIN I (HIGH SENSITIVITY)
Troponin I (High Sensitivity): 30 ng/L — ABNORMAL HIGH (ref <18)
Troponin I (High Sensitivity): 524 ng/L (ref <18)
Troponin I (High Sensitivity): 1010 ng/L (ref <18)
Troponin I (High Sensitivity): 1181 ng/L (ref <18)
Troponin I (High Sensitivity): 1232 ng/L (ref <18)

## 2022-05-24 LAB — LACTIC ACID, PLASMA
Lactic Acid, Venous: 2 mmol/L (ref 0.5–1.9)
Lactic Acid, Venous: 1.4 mmol/L (ref 0.5–1.9)

## 2022-05-24 LAB — BRAIN NATRIURETIC PEPTIDE: B Natriuretic Peptide: 530.5 pg/mL — ABNORMAL HIGH (ref 0.0–100.0)

## 2022-05-24 LAB — PROTIME-INR
INR: 1.1 (ref 0.8–1.2)
Prothrombin Time: 14 seconds (ref 11.4–15.2)

## 2022-05-24 LAB — LIPASE, BLOOD: Lipase: 28 U/L (ref 11–51)

## 2022-05-24 MED ORDER — ISOSORBIDE MONONITRATE ER 30 MG PO TB24
30.0000 mg | ORAL_TABLET | Freq: Every day | ORAL | Status: DC
  Administered 2022-05-24 – 2022-05-27 (×4): 30 mg via ORAL
  Filled 2022-05-24 (×4): qty 1

## 2022-05-24 MED ORDER — HEPARIN BOLUS VIA INFUSION
4000.0000 [IU] | Freq: Once | INTRAVENOUS | Status: AC
  Administered 2022-05-24: 4000 [IU] via INTRAVENOUS
  Filled 2022-05-24: qty 4000

## 2022-05-24 MED ORDER — PANTOPRAZOLE SODIUM 40 MG PO TBEC
40.0000 mg | DELAYED_RELEASE_TABLET | Freq: Every day | ORAL | Status: DC
  Administered 2022-05-24 – 2022-05-27 (×4): 40 mg via ORAL
  Filled 2022-05-24 (×4): qty 1

## 2022-05-24 MED ORDER — NITROGLYCERIN 2 % TD OINT: 1.0000 [in_us] | TOPICAL_OINTMENT | Freq: Once | TRANSDERMAL | Status: DC

## 2022-05-24 MED ORDER — ASPIRIN 325 MG PO TABS
325.0000 mg | ORAL_TABLET | Freq: Once | ORAL | Status: AC
  Administered 2022-05-24: 325 mg via ORAL
  Filled 2022-05-24: qty 1

## 2022-05-24 MED ORDER — LEFLUNOMIDE 20 MG PO TABS
20.0000 mg | ORAL_TABLET | Freq: Every day | ORAL | Status: DC
  Administered 2022-05-24 – 2022-05-27 (×4): 20 mg via ORAL
  Filled 2022-05-24 (×4): qty 1

## 2022-05-24 MED ORDER — AMLODIPINE BESYLATE 5 MG PO TABS
2.5000 mg | ORAL_TABLET | Freq: Every day | ORAL | Status: DC
  Administered 2022-05-24 – 2022-05-26 (×3): 2.5 mg via ORAL
  Filled 2022-05-24 (×3): qty 1

## 2022-05-24 MED ORDER — TRAZODONE HCL 50 MG PO TABS: 25.0000 mg | ORAL_TABLET | Freq: Every evening | ORAL | Status: DC | PRN

## 2022-05-24 MED ORDER — ALLOPURINOL 100 MG PO TABS
100.0000 mg | ORAL_TABLET | Freq: Two times a day (BID) | ORAL | Status: DC
  Administered 2022-05-24 – 2022-05-27 (×6): 100 mg via ORAL
  Filled 2022-05-24 (×6): qty 1

## 2022-05-24 MED ORDER — METOPROLOL SUCCINATE ER 25 MG PO TB24
25.0000 mg | ORAL_TABLET | Freq: Every day | ORAL | Status: DC
  Administered 2022-05-24 – 2022-05-26 (×3): 25 mg via ORAL
  Filled 2022-05-24 (×3): qty 1

## 2022-05-24 MED ORDER — SODIUM CHLORIDE 0.9% FLUSH
3.0000 mL | Freq: Two times a day (BID) | INTRAVENOUS | Status: DC
  Administered 2022-05-24 – 2022-05-26 (×4): 3 mL via INTRAVENOUS

## 2022-05-24 MED ORDER — POLYETHYLENE GLYCOL 3350 17 G PO PACK: 17.0000 g | PACK | Freq: Every day | ORAL | Status: DC | PRN

## 2022-05-24 MED ORDER — HEPARIN (PORCINE) 25000 UT/250ML-% IV SOLN
1050.0000 [IU]/h | INTRAVENOUS | Status: DC
  Administered 2022-05-24: 800 [IU]/h via INTRAVENOUS
  Administered 2022-05-25 – 2022-05-26 (×2): 950 [IU]/h via INTRAVENOUS
  Filled 2022-05-24 (×3): qty 250

## 2022-05-24 MED ORDER — ONDANSETRON HCL 4 MG PO TABS: 4.0000 mg | ORAL_TABLET | Freq: Four times a day (QID) | ORAL | Status: DC | PRN

## 2022-05-24 MED ORDER — ONDANSETRON HCL 4 MG/2ML IJ SOLN: 4.0000 mg | Freq: Four times a day (QID) | INTRAMUSCULAR | Status: DC | PRN

## 2022-05-24 NOTE — ED Notes (Signed)
MD Dione Plover at bedside.

## 2022-05-24 NOTE — ED Notes (Addendum)
Assumed care from [place], [place]. Pt resting comfortably in bed at this time. Pt denies any current needs or questions. Call light with in reach. Pt family at bedside requesting an update. RN notified MD. Pt remains on bipap. No apparent distress at this time.

## 2022-05-24 NOTE — Progress Notes (Signed)
ANTICOAGULATION CONSULT NOTE - Initial Consult  Pharmacy Consult for Heparin Drip Indication: chest pain/ACS  Allergies  Allergen Reactions   Aspirin Other (See Comments)    Pt states she has metal clips and was told not to take asa   Butalbital-Apap-Caffeine Other (See Comments)   Ciprofloxacin Other (See Comments)   Diazepam Other (See Comments)   Ibuprofen Other (See Comments)   Lovastatin Other (See Comments)    Other reaction(s): Unknown   Other     Other reaction(s): Unknown   Propoxyphene Other (See Comments)   Statins     Other reaction(s): Muscle Pain Other reaction(s): Muscle Pain   Sulfa Antibiotics Other (See Comments)   Tapentadol Diarrhea    Patient Measurements: Height: '5\' 4"'$  (162.6 cm) Weight: 69.9 kg (154 lb) IBW/kg (Calculated) : 54.7 Heparin Dosing Weight: 68.8 kg  Vital Signs: Temp: 97.6 F (36.4 C) (01/06 1300) Temp Source: Oral (01/06 0652) BP: 137/46 (01/06 1430) Pulse Rate: 78 (01/06 1430)  Labs: Recent Labs    05/24/22 0637 05/24/22 0949 05/24/22 1246  HGB 11.8*  --   --   HCT 37.7  --   --   PLT 173  --   --   LABPROT 14.0  --   --   INR 1.1  --   --   CREATININE 0.82  --   --   TROPONINIHS 30* 524* 1,010*  1,181*    Estimated Creatinine Clearance: 43.8 mL/min (by C-G formula based on SCr of 0.82 mg/dL).   Medical History: Past Medical History:  Diagnosis Date   Arthritis    Chronic diarrhea    Diabetes mellitus without complication ([place])    Gout    Hypercholesteremia    Hypertension    Spastic colon    Assessment: Patient is a 89yo female presenting with shortness of breath. Troponins trending upward. Pharmacy consulted for Heparin dosing. No prior anticoagulants noted in history.  Goal of Therapy:  Heparin level 0.3-0.7 units/ml Monitor platelets by anticoagulation protocol: Yes   Plan:  Give 4000 units bolus x 1 Start heparin infusion at 800 units/hr Check anti-Xa level in 8 hours and daily while on  heparin Continue to monitor H&H and platelets  Paulina Fusi, PharmD, BCPS 05/24/2022 5:17 PM

## 2022-05-24 NOTE — ED Notes (Signed)
Cardiology NP at bedside.

## 2022-05-24 NOTE — ED Notes (Signed)
Pt assisted to bedside commode with stand by assist.

## 2022-05-24 NOTE — Assessment & Plan Note (Signed)
Hypertension-continue amlodipine, Imdur, metoprolol Gout-continue allopurinol Return if arthritis-continue leflunomide GERD-continue PPI DM2-hold glipizide and metformin, Sliding scale correction

## 2022-05-24 NOTE — ED Notes (Signed)
Pt family requesting update, MD Dione Plover notified.

## 2022-05-24 NOTE — ED Notes (Signed)
Pt taken off bipap per MD [facility] request. Pt o2 saturation 99% on room air. RN informed patient to let us know if she begins to feel more short of breath or have any chest pain.

## 2022-05-24 NOTE — ED Notes (Signed)
Family at the bedside. This RN introduced herself and explained the plan of care for the shift. Pt and family deny any complaints at this time.

## 2022-05-24 NOTE — ED Notes (Signed)
Pt put comfortably in hospital bed. Pt and family denies any additional needs or concerns at this time.

## 2022-05-24 NOTE — Assessment & Plan Note (Addendum)
Patient presenting with acute shortness of breath and chest tightness as well as reportedly extremely elevated blood pressures.  Last had Lexi scan in March 2022 (results in care everywhere) with no reversible ischemia seen but reduced EF of 33%.  Possible etiology of presentation include flash pulmonary edema secondary to elevated blood pressures which is now seem to mostly resolved, unstable angina given recurrence of symptoms over 2 nights, or acute heart failure although there are no precipitating factors evident.  Troponin is elevated to the 500s now, will continue to trend and would like to put patient on a heparin drip but she is very hesitant due to history of AVM requiring neurosurgical intervention, will defer to cardiology eval. - Cardiology consulted, eval pending - Trend troponin to peak - Does not appear volume overloaded on exam with no lower extremity edema, no elevation in JVD, and although currently on BiPAP she is speaking in full sentences and appears ready to come off.  Defer diuresis at this time

## 2022-05-24 NOTE — Progress Notes (Signed)
Had several conversations with patient regarding ASA and anticoagulation as her troponin continues to rise, significantly increasing suspicion for ACS.  After several conversations with both myself and cardiology, patient agreed to both aspirin and anticoagulation. Orders placed, RN notified.

## 2022-05-24 NOTE — Consult Note (Signed)
Dana Green is a 89 y.o. female  [id number]  Primary Cardiologist: [facility] Reason for Consultation: acute respiratory distress   HPI: Patient is a 89 year old female with past medical history significant for DM, HTN, RA, hyperlipidemia, GERD, with EF of 40-45% on echo 12/2017. Patient presented to ED via EMS on 05/24/21 with chief complaint of acute onset severe shortness of breath. Patient reports waking up early in the morning the past two days, with sudden onset shortness of breath.    Review of Systems: currently denies chest pain, shortness of breath, dizziness.    Past Medical History:  Diagnosis Date   Arthritis    Chronic diarrhea    Diabetes mellitus without complication ([place])    Gout    Hypercholesteremia    Hypertension    Spastic colon     (Not in a hospital admission)     nitroGLYCERIN  1 inch Topical Once    Infusions:   Allergies  Allergen Reactions   Aspirin Other (See Comments)    Pt states she has metal clips and was told not to take asa   Butalbital-Apap-Caffeine Other (See Comments)   Ciprofloxacin Other (See Comments)   Diazepam Other (See Comments)   Ibuprofen Other (See Comments)   Lovastatin Other (See Comments)    Other reaction(s): Unknown   Other     Other reaction(s): Unknown   Propoxyphene Other (See Comments)   Statins     Other reaction(s): Muscle Pain Other reaction(s): Muscle Pain   Sulfa Antibiotics Other (See Comments)   Tapentadol Diarrhea    Social History   Socioeconomic History   Marital status: Married    Spouse name: Not on file   Number of children: Not on file   Years of education: Not on file   Highest education level: Not on file  Occupational History   Not on file  Tobacco Use   Smoking status: Never   Smokeless tobacco: Never  Substance and Sexual Activity   Alcohol use: No   Drug use: No   Sexual activity: Not on file  Other Topics Concern   Not on file  Social History Narrative   Not on  file   Social Determinants of Health   Financial Resource Strain: Not on file  Food Insecurity: Not on file  Transportation Needs: Not on file  Physical Activity: Not on file  Stress: Not on file  Social Connections: Not on file  Intimate Partner Violence: Not on file    Family History  Problem Relation Age of Onset   Prostate cancer Father    Bladder Cancer Father    Prostate cancer Brother    Kidney disease Neg Hx     PHYSICAL EXAM: Vitals:   05/24/22 1214 05/24/22 1215  BP:  138/60  Pulse:  83  Resp: 19   Temp:    SpO2:  92%    No intake or output data in the 24 hours ending 05/24/22 1253  General:  Well appearing. No respiratory difficulty HEENT: normal Neck: supple. no JVD. Carotids 2+ bilat; no bruits. No lymphadenopathy or thryomegaly appreciated. Cor: PMI nondisplaced. Regular rate & rhythm. No rubs, gallops or murmurs. Lungs: clear Abdomen: soft, nontender, nondistended. No hepatosplenomegaly. No bruits or masses. Good bowel sounds. Extremities: no cyanosis, clubbing, rash, edema Neuro: alert & oriented x 3, cranial nerves grossly intact. moves all 4 extremities w/o difficulty. Affect pleasant.  ECG: sinus rhythm, HR 96, LVH, non-specific ST changes  Results for orders  placed or performed during the hospital encounter of 05/24/22 (from the past 24 hour(s))  Blood gas, venous     Status: None   Collection Time: 05/24/22  6:35 AM  Result Value Ref Range   pH, Ven 7.33 7.25 - 7.43   pCO2, Ven 47 44 - 60 mmHg   pO2, Ven 40 32 - 45 mmHg   Bicarbonate 24.8 20.0 - 28.0 mmol/L   Acid-base deficit 1.5 0.0 - 2.0 mmol/L   O2 Saturation 46.9 %   Patient temperature 37.0    Collection site VEIN   Resp panel by RT-PCR (RSV, Flu A&B, Covid) Anterior Nasal Swab     Status: None   Collection Time: 05/24/22  6:36 AM   Specimen: Anterior Nasal Swab  Result Value Ref Range   SARS Coronavirus 2 by RT PCR NEGATIVE NEGATIVE   Influenza A by PCR NEGATIVE NEGATIVE    Influenza B by PCR NEGATIVE NEGATIVE   Resp Syncytial Virus by PCR NEGATIVE NEGATIVE  Lactic acid, plasma     Status: Abnormal   Collection Time: 05/24/22  6:37 AM  Result Value Ref Range   Lactic Acid, Venous 2.0 (HH) 0.5 - 1.9 mmol/L  Comprehensive metabolic panel     Status: Abnormal   Collection Time: 05/24/22  6:37 AM  Result Value Ref Range   Sodium 141 135 - 145 mmol/L   Potassium 3.7 3.5 - 5.1 mmol/L   Chloride 109 98 - 111 mmol/L   CO2 22 22 - 32 mmol/L   Glucose, Bld 229 (H) 70 - 99 mg/dL   BUN 21 8 - 23 mg/dL   Creatinine, Ser 0.82 0.44 - 1.00 mg/dL   Calcium 8.0 (L) 8.9 - 10.3 mg/dL   Total Protein 6.3 (L) 6.5 - 8.1 g/dL   Albumin 3.5 3.5 - 5.0 g/dL   AST 21 15 - 41 U/L   ALT 12 0 - 44 U/L   Alkaline Phosphatase 49 38 - 126 U/L   Total Bilirubin 1.0 0.3 - 1.2 mg/dL   GFR, Estimated >60 >60 mL/min   Anion gap 10 5 - 15  CBC with Differential     Status: Abnormal   Collection Time: 05/24/22  6:37 AM  Result Value Ref Range   WBC 5.9 4.0 - 10.5 K/uL   RBC 4.22 3.87 - 5.11 MIL/uL   Hemoglobin 11.8 (L) 12.0 - 15.0 g/dL   HCT 37.7 36.0 - 46.0 %   MCV 89.3 80.0 - 100.0 fL   MCH 28.0 26.0 - 34.0 pg   MCHC 31.3 30.0 - 36.0 g/dL   RDW 15.7 (H) 11.5 - 15.5 %   Platelets 173 150 - 400 K/uL   nRBC 0.0 0.0 - 0.2 %   Neutrophils Relative % 68 %   Neutro Abs 4.0 1.7 - 7.7 K/uL   Lymphocytes Relative 22 %   Lymphs Abs 1.3 0.7 - 4.0 K/uL   Monocytes Relative 8 %   Monocytes Absolute 0.5 0.1 - 1.0 K/uL   Eosinophils Relative 1 %   Eosinophils Absolute 0.1 0.0 - 0.5 K/uL   Basophils Relative 1 %   Basophils Absolute 0.1 0.0 - 0.1 K/uL   Immature Granulocytes 0 %   Abs Immature Granulocytes 0.01 0.00 - 0.07 K/uL  Protime-INR     Status: None   Collection Time: 05/24/22  6:37 AM  Result Value Ref Range   Prothrombin Time 14.0 11.4 - 15.2 seconds   INR 1.1 0.8 - 1.2  Lipase, blood  Status: None   Collection Time: 05/24/22  6:37 AM  Result Value Ref Range   Lipase 28  11 - 51 U/L  Brain natriuretic peptide     Status: Abnormal   Collection Time: 05/24/22  6:37 AM  Result Value Ref Range   B Natriuretic Peptide 530.5 (H) 0.0 - 100.0 pg/mL  Troponin I (High Sensitivity)     Status: Abnormal   Collection Time: 05/24/22  6:37 AM  Result Value Ref Range   Troponin I (High Sensitivity) 30 (H) <18 ng/L  Lactic acid, plasma     Status: None   Collection Time: 05/24/22  9:48 AM  Result Value Ref Range   Lactic Acid, Venous 1.4 0.5 - 1.9 mmol/L  Troponin I (High Sensitivity)     Status: Abnormal   Collection Time: 05/24/22  9:49 AM  Result Value Ref Range   Troponin I (High Sensitivity) 524 (HH) <18 ng/L   DG Chest Port 1 View  Result Date: 05/24/2022 CLINICAL DATA:  Respiratory distress. EXAM: PORTABLE CHEST 1 VIEW COMPARISON:  12/27/2017 FINDINGS: 0644 hours. Lungs are hyperexpanded. The cardio pericardial silhouette is enlarged. Vascular congestion with diffuse interstitial opacity is compatible with edema. Small left pleural effusion evident. IMPRESSION: Pulmonary edema with small left pleural effusion. Electronically Signed   By: Misty Stanley M.D.   On: 05/24/2022 07:01     ASSESSMENT AND PLAN: Patient with sudden onset shortness of breath x2 episodes. Troponin levels 30>524>1181. Recommend aspirin and IV heparin. 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 Discussed at length with risks and benefits of taking aspirin and heparin. Patient has agreed to taking aspirin. Hospitalist placing order for consult to neurosurgery for recommendation with heparin. Will continue to follow.   Engineer, drilling FNP-C

## 2022-05-24 NOTE — ED Notes (Signed)
Pt assisted to bedside commode

## 2022-05-24 NOTE — H&P (Signed)
History and Physical    Patient: Dana Green DIY:[id number] DOB: 03/24/1930 DOA: 05/24/2022 DOS: the patient was seen and examined on 05/24/2022 PCP: Tracie Harrier, MD  Patient coming from: Home  Chief Complaint:  Chief Complaint  Patient presents with   Shortness of Breath    Patient C/O SOB that began tonight. Patient denies any chest pain, asthma, COPD, fevers, or other symptoms.   HPI: Dana Green is a 89 y.o. female with medical history significant for rheumatoid arthritis, diffuse OA, GAD, type 2 diabetes with polyneuropathy, unstable angina, hypertension, who presents with shortness of breath.  Patient reports that 2 nights ago she was awoken out of sleep around 3 AM with intense shortness of breath and chest pressure that was centrally located.  She denied any associated nausea or diaphoresis.  She reported that the symptoms are constant and lasted for a few hours until around 8 AM when they abated.  Very early this morning she had a recurrence of the symptoms which were even worse and she decided to present for care.  She denies missing any medications or having any sort of illness recently.  At baseline she lives alone and does all her own cooking and cleaning and is very active.  Of note she reports a history of a vascular problem (from review of care everywhere [place] records appears she had an AVM) in 1981 that required surgery and she still has clips in her brain.  She reports that she cannot have aspirin or blood thinners because of this.  Patient arrived via EMS, prior to arrival that assessed her and found to be in the mid to low 80s with minimal response to nasal cannula oxygen and was subsequently dropped further with ambulation.  There is concern for possible need for intubation but she was placed on CPAP and her oxygenation improved significantly.  In the ED she was placed on BiPAP, her vital signs were normal.  Lab workup was notable for CMP with glucose of 229,  CO2 normal at 22.  CBC with mild anemia the only abnormality.  BNP elevated at 530, initial troponin was rising from 38 on admission to 524 on recheck.  Her lactic acid was 2, downtrending to 1.4 on recheck.  Flu, COVID, RSV swabs are negative.  Chest x-ray obtained showed diffuse pulmonary edema with a small left pleural effusion.  EKG showed new ST depressions in V5 and V4 compared to prior.  He was given nitroglycerin paste and admitted for further management.  Review of Systems: As mentioned in the history of present illness. All other systems reviewed and are negative. Past Medical History:  Diagnosis Date   Arthritis    Chronic diarrhea    Diabetes mellitus without complication ([place])    Gout    Hypercholesteremia    Hypertension    Spastic colon    Past Surgical History:  Procedure Laterality Date   ABDOMINAL HYSTERECTOMY     APPENDECTOMY     BRAIN SURGERY     Mass Removal; Anuerysm Repair   BREAST BIOPSY Left    neg   CHOLECYSTECTOMY     Floating Kidney Repair     REPLACEMENT TOTAL KNEE Left 2000   Social History:  reports that she has never smoked. She has never used smokeless tobacco. She reports that she does not drink alcohol and does not use drugs.  Allergies  Allergen Reactions   Aspirin Other (See Comments)    Pt states she has metal clips and  was told not to take asa   Butalbital-Apap-Caffeine Other (See Comments)   Ciprofloxacin Other (See Comments)   Diazepam Other (See Comments)   Ibuprofen Other (See Comments)   Lovastatin Other (See Comments)    Other reaction(s): Unknown   Other     Other reaction(s): Unknown   Propoxyphene Other (See Comments)   Statins     Other reaction(s): Muscle Pain Other reaction(s): Muscle Pain   Sulfa Antibiotics Other (See Comments)   Tapentadol Diarrhea    Family History  Problem Relation Age of Onset   Prostate cancer Father    Bladder Cancer Father    Prostate cancer Brother    Kidney disease Neg Hx     Prior to  Admission medications   Medication Sig Start Date End Date Taking? Authorizing Provider  allopurinol (ZYLOPRIM) 100 MG tablet Take 100 mg by mouth 2 (two) times daily.     [provider]  amLODipine (NORVASC) 5 MG tablet Take 5 mg by mouth daily.     [provider]  colestipol (COLESTID) 1 g tablet Take 1 g by mouth daily as needed.  05/30/15   [provider]  doxycycline (DORYX) 100 MG EC tablet Take 100 mg by mouth 2 (two) times daily.    [provider]  glipiZIDE (GLUCOTROL) 10 MG tablet Take 10 mg by mouth 2 (two) times daily before a meal.  11/26/15   [provider]  isosorbide mononitrate (IMDUR) 30 MG 24 hr tablet Take 1 tablet (30 mg total) by mouth daily. 12/28/17   Dustin Flock, MD  leflunomide (ARAVA) 20 MG tablet Take 20 mg by mouth daily.    [provider]  loratadine (CLARITIN) 10 MG tablet Take 10 mg by mouth daily.     [provider]  metFORMIN (GLUCOPHAGE) 1000 MG tablet TAKE ONE TABLET BY MOUTH TWICE DAILY 12/07/14   [provider]  metoprolol succinate (TOPROL-XL) 50 MG 24 hr tablet Take 50 mg by mouth daily.     [provider]  Multiple Vitamins-Minerals (OCUVITE ADULT FORMULA) CAPS Take 1 capsule by mouth daily.     [provider]  omeprazole (PRILOSEC) 20 MG capsule Take 20 mg by mouth daily.  02/28/15   [provider]  oxybutynin (DITROPAN XL) 15 MG 24 hr tablet Take 1 tablet (15 mg total) by mouth daily. 05/29/16   Zara Council A, PA-C  pantoprazole (PROTONIX) 40 MG tablet Take 1 tablet (40 mg total) by mouth daily. 12/28/17 12/28/18  Dustin Flock, MD  sitaGLIPtin (JANUVIA) 50 MG tablet Take 50 mg by mouth daily.    [provider]    Physical Exam: Vitals:   05/24/22 1145 05/24/22 1200 05/24/22 1214 05/24/22 1215  BP: (!) 151/62 (!) 155/59  138/60  Pulse: 81 81  83  Resp: '20 17 19   '$ Temp:      TempSrc:      SpO2: 94% 93%  92%  Weight:       Height:       Physical Exam Vitals reviewed.  Constitutional:      General: She is not in acute distress.    Appearance: She is well-developed. She is not ill-appearing, toxic-appearing or diaphoretic.  HENT:     Head: Normocephalic and atraumatic.  Cardiovascular:     Rate and Rhythm: Normal rate and regular rhythm.     Heart sounds: Normal heart sounds. No murmur heard.    No friction rub. No gallop.  Comments: No JVD distension at about 45 degree angle Pulmonary:     Effort: Pulmonary effort is normal. No tachypnea.     Breath sounds: Examination of the right-lower field reveals decreased breath sounds. Examination of the left-lower field reveals decreased breath sounds. Decreased breath sounds present. No wheezing, rhonchi or rales.  Abdominal:     General: Bowel sounds are normal.     Palpations: Abdomen is soft. There is no mass.     Tenderness: There is no abdominal tenderness.  Musculoskeletal:     Right lower leg: No edema.     Left lower leg: No edema.  Skin:    General: Skin is warm and dry.  Neurological:     General: No focal deficit present.     Mental Status: She is alert and oriented to person, place, and time.  Psychiatric:        Mood and Affect: Mood normal.        Behavior: Behavior normal.      Data Reviewed: Results for orders placed or performed during the hospital encounter of 05/24/22 (from the past 24 hour(s))  Blood gas, venous     Status: None   Collection Time: 05/24/22  6:35 AM  Result Value Ref Range   pH, Ven 7.33 7.25 - 7.43   pCO2, Ven 47 44 - 60 mmHg   pO2, Ven 40 32 - 45 mmHg   Bicarbonate 24.8 20.0 - 28.0 mmol/L   Acid-base deficit 1.5 0.0 - 2.0 mmol/L   O2 Saturation 46.9 %   Patient temperature 37.0    Collection site VEIN   Resp panel by RT-PCR (RSV, Flu A&B, Covid) Anterior Nasal Swab     Status: None   Collection Time: 05/24/22  6:36 AM   Specimen: Anterior Nasal Swab  Result Value Ref Range   SARS Coronavirus 2 by RT  PCR NEGATIVE NEGATIVE   Influenza A by PCR NEGATIVE NEGATIVE   Influenza B by PCR NEGATIVE NEGATIVE   Resp Syncytial Virus by PCR NEGATIVE NEGATIVE  Lactic acid, plasma     Status: Abnormal   Collection Time: 05/24/22  6:37 AM  Result Value Ref Range   Lactic Acid, Venous 2.0 (HH) 0.5 - 1.9 mmol/L  Comprehensive metabolic panel     Status: Abnormal   Collection Time: 05/24/22  6:37 AM  Result Value Ref Range   Sodium 141 135 - 145 mmol/L   Potassium 3.7 3.5 - 5.1 mmol/L   Chloride 109 98 - 111 mmol/L   CO2 22 22 - 32 mmol/L   Glucose, Bld 229 (H) 70 - 99 mg/dL   BUN 21 8 - 23 mg/dL   Creatinine, Ser 0.82 0.44 - 1.00 mg/dL   Calcium 8.0 (L) 8.9 - 10.3 mg/dL   Total Protein 6.3 (L) 6.5 - 8.1 g/dL   Albumin 3.5 3.5 - 5.0 g/dL   AST 21 15 - 41 U/L   ALT 12 0 - 44 U/L   Alkaline Phosphatase 49 38 - 126 U/L   Total Bilirubin 1.0 0.3 - 1.2 mg/dL   GFR, Estimated >60 >60 mL/min   Anion gap 10 5 - 15  CBC with Differential     Status: Abnormal   Collection Time: 05/24/22  6:37 AM  Result Value Ref Range   WBC 5.9 4.0 - 10.5 K/uL   RBC 4.22 3.87 - 5.11 MIL/uL   Hemoglobin 11.8 (L) 12.0 - 15.0 g/dL   HCT 37.7 36.0 - 46.0 %  MCV 89.3 80.0 - 100.0 fL   MCH 28.0 26.0 - 34.0 pg   MCHC 31.3 30.0 - 36.0 g/dL   RDW 15.7 (H) 11.5 - 15.5 %   Platelets 173 150 - 400 K/uL   nRBC 0.0 0.0 - 0.2 %   Neutrophils Relative % 68 %   Neutro Abs 4.0 1.7 - 7.7 K/uL   Lymphocytes Relative 22 %   Lymphs Abs 1.3 0.7 - 4.0 K/uL   Monocytes Relative 8 %   Monocytes Absolute 0.5 0.1 - 1.0 K/uL   Eosinophils Relative 1 %   Eosinophils Absolute 0.1 0.0 - 0.5 K/uL   Basophils Relative 1 %   Basophils Absolute 0.1 0.0 - 0.1 K/uL   Immature Granulocytes 0 %   Abs Immature Granulocytes 0.01 0.00 - 0.07 K/uL  Protime-INR     Status: None   Collection Time: 05/24/22  6:37 AM  Result Value Ref Range   Prothrombin Time 14.0 11.4 - 15.2 seconds   INR 1.1 0.8 - 1.2  Lipase, blood     Status: None    Collection Time: 05/24/22  6:37 AM  Result Value Ref Range   Lipase 28 11 - 51 U/L  Brain natriuretic peptide     Status: Abnormal   Collection Time: 05/24/22  6:37 AM  Result Value Ref Range   B Natriuretic Peptide 530.5 (H) 0.0 - 100.0 pg/mL  Troponin I (High Sensitivity)     Status: Abnormal   Collection Time: 05/24/22  6:37 AM  Result Value Ref Range   Troponin I (High Sensitivity) 30 (H) <18 ng/L  Lactic acid, plasma     Status: None   Collection Time: 05/24/22  9:48 AM  Result Value Ref Range   Lactic Acid, Venous 1.4 0.5 - 1.9 mmol/L  Troponin I (High Sensitivity)     Status: Abnormal   Collection Time: 05/24/22  9:49 AM  Result Value Ref Range   Troponin I (High Sensitivity) 524 (HH) <18 ng/L    DG Chest Port 1 View CLINICAL DATA:  Respiratory distress.  EXAM: PORTABLE CHEST 1 VIEW  COMPARISON:  12/27/2017  FINDINGS: 0644 hours. Lungs are hyperexpanded. The cardio pericardial silhouette is enlarged. Vascular congestion with diffuse interstitial opacity is compatible with edema. Small left pleural effusion evident.  IMPRESSION: Pulmonary edema with small left pleural effusion.  Electronically Signed   By: Misty Stanley M.D.   On: 05/24/2022 07:01  Chest x-ray (my read): Diffuse pulmonary edema, left-sided pleural effusion  EKG (my read): Sinus rhythm, possible right axis, likely ventricular hypertrophy, ST depressions in V5 and V6 with T wave inversions.  Compared to prior the changes in V5 and V6 are new.  Assessment and Plan: Dana Green is a 89 y.o. female with medical history significant for rheumatoid arthritis, diffuse OA, GAD, type 2 diabetes with polyneuropathy, unstable angina, hypertension, who presents with shortness of breath and chest tightness concerning for possible cardiac etiology.  * Acute hypoxemic respiratory failure ([place]) Patient presenting with acute shortness of breath and chest tightness as well as reportedly extremely elevated  blood pressures.  Last had Lexi scan in March 2022 (results in care everywhere) with no reversible ischemia seen but reduced EF of 33%.  Possible etiology of presentation include flash pulmonary edema secondary to elevated blood pressures which is now seem to mostly resolved, unstable angina given recurrence of symptoms over 2 nights, or acute heart failure although there are no precipitating factors evident.  Troponin is elevated to  the 500s now, will continue to trend and would like to put patient on a heparin drip but she is very hesitant due to history of AVM requiring neurosurgical intervention, will defer to cardiology eval. - Cardiology consulted, eval pending - Trend troponin to peak - Does not appear volume overloaded on exam with no lower extremity edema, no elevation in JVD, and although currently on BiPAP she is speaking in full sentences and appears ready to come off.  Defer diuresis at this time  Known medical problems Hypertension-continue amlodipine, Imdur, metoprolol Gout-continue allopurinol Return if arthritis-continue leflunomide GERD-continue PPI DM2-hold glipizide and metformin, Sliding scale correction      Advance Care Planning:   Code Status: Full Code , confirmed with patient  Consults: Cardiology  Family Communication: Daughter and brother updated at bedside  Severity of Illness: The appropriate patient status for this patient is INPATIENT. Inpatient status is judged to be reasonable and necessary in order to provide the required intensity of service to ensure the patient's safety. The patient's presenting symptoms, physical exam findings, and initial radiographic and laboratory data in the context of their chronic comorbidities is felt to place them at high risk for further clinical deterioration. Furthermore, it is not anticipated that the patient will be medically stable for discharge from the hospital within 2 midnights of admission.   * I certify that at the  point of admission it is my clinical judgment that the patient will require inpatient hospital care spanning beyond 2 midnights from the point of admission due to high intensity of service, high risk for further deterioration and high frequency of surveillance required.*  Author: Clarnce Flock, MD 05/24/2022 1:03 PM  For on call review [email or web address].

## 2022-05-24 NOTE — ED Provider Notes (Signed)
Care assumed of patient from outgoing provider.  See their note for initial history, exam and plan.  Clinical Course as of 05/24/22 0715  Sat May 24, 2022  0714 Acutely started feeling sob suddenly yesterday. Hypoxic to 80s with EMS and started on CPAP.  BP 200/100 - on nitro paste.  CXR w/ new CHF. Lasix given.  [SM]    Clinical Course User Index [SM] Nathaniel Man, MD     Nathaniel Man, MD 05/24/22 [id number]

## 2022-05-24 NOTE — ED Notes (Signed)
Pt assisted to the toilet at this time.

## 2022-05-24 NOTE — ED Notes (Signed)
Pt meal tray on side table. Pt states she is not that hungry right now.

## 2022-05-24 NOTE — ED Provider Notes (Signed)
[facility] Provider Note    Event Date/Time   First MD Initiated Contact with Patient 05/24/22 [phone]     (approximate)   History   Shortness of Breath (Patient C/O SOB that began tonight. Patient denies any chest pain, asthma, COPD, fevers, or other symptoms.)   HPI  Level 5 caveat:  history/ROS limited by acute/critical illness  Dana Green is a 89 y.o. female who is generally healthy and independent for her age.  She denies any heart or lung problems.  She presents by EMS for evaluation of acute onset severe shortness of breath.  She states that she felt fine until last night when she became acutely short of breath.  The shortness of breath got worse until EMS was called.  The paramedics report that when first responders arrived, she was in the low to mid 80s and she was placed on oxygen to minimal benefit.  The paramedics arrived they had to have her walk a few steps to a better position and the paramedic reports that her SpO2 dropped precipitously.  She became much more short of breath, tripoding and speaking in only single words and they were worried they may have to intubate her.  However they placed her on CPAP and her breathing improved, SpO2 improved, and she was brought to the hospital.  Upon arrival she is able to speak in short sentences and said that her breathing is better.  She denies pain.  She denies fever and chills.  She said that she has never had anything like this happen to her in the past.  She has no history of heart failure, COPD/asthma, or other cardiopulmonary condition.     Physical Exam   Triage Vital Signs: ED Triage Vitals [05/24/22 0635]  Enc Vitals Group     BP      Pulse      Resp      Temp      Temp src      SpO2      Weight 69.9 kg (154 lb)     Height 1.626 m ('5\' 4"'$ )     Head Circumference      Peak Flow      Pain Score      Pain Loc      Pain Edu?      Excl. in [place]?     Most recent vital signs: Vitals:    05/24/22 0652 05/24/22 0700  BP: (!) 131/53 (!) 121/51  Pulse: 96 93  Resp: 20 (!) 23  Temp: 97.7 F (36.5 C)   SpO2: 99% 99%     General: Awake, no distress.  Moderate respiratory distress on CPAP by EMS upon arrival. CV:  Good peripheral perfusion.  Regular rate and rhythm.  Normal heart sounds. Resp:  Decreased air movement throughout but without wheezing.  No obvious crackles. Abd:  No distention.  No tenderness to palpation of the abdomen. Other:  Alert and oriented, interacting and talking with me appropriately.   ED Results / Procedures / Treatments   Labs (all labs ordered are listed, but only abnormal results are displayed) Labs Reviewed  LACTIC ACID, PLASMA - Abnormal; Notable for the following components:      Result Value   Lactic Acid, Venous 2.0 (*)    All other components within normal limits  COMPREHENSIVE METABOLIC PANEL - Abnormal; Notable for the following components:   Glucose, Bld 229 (*)    Calcium 8.0 (*)    Total  Protein 6.3 (*)    All other components within normal limits  CBC WITH DIFFERENTIAL/PLATELET - Abnormal; Notable for the following components:   Hemoglobin 11.8 (*)    RDW 15.7 (*)    All other components within normal limits  BRAIN NATRIURETIC PEPTIDE - Abnormal; Notable for the following components:   B Natriuretic Peptide 530.5 (*)    All other components within normal limits  TROPONIN I (HIGH SENSITIVITY) - Abnormal; Notable for the following components:   Troponin I (High Sensitivity) 30 (*)    All other components within normal limits  RESP PANEL BY RT-PCR (RSV, FLU A&B, COVID)  [id number]  CULTURE, BLOOD (SINGLE)  PROTIME-INR  LIPASE, BLOOD  BLOOD GAS, VENOUS  LACTIC ACID, PLASMA  TROPONIN I (HIGH SENSITIVITY)     EKG  ED ECG REPORT I, Hinda Kehr, the attending physician, personally viewed and interpreted this ECG.  Date: 05/24/2022 EKG Time: 6:48 AM Rate: 96 Rhythm: normal sinus rhythm QRS Axis: normal Intervals:  normal ST/T Wave abnormalities: Non-specific ST segment / T-wave changes, but no clear evidence of acute ischemia. Narrative Interpretation: no definitive evidence of acute ischemia; does not meet STEMI criteria.  Possible demand ischemia from respiratory failure.     RADIOLOGY I viewed and interpreted the patient's 1 view chest x-ray.  Patient has some pulmonary edema.  Radiologist report confirms this finding and also mentions small pleural effusion.    PROCEDURES:  Critical Care performed: Yes, see critical care procedure note(s)  .1-3 Lead EKG Interpretation  Performed by: Hinda Kehr, MD Authorized by: Hinda Kehr, MD     Interpretation: normal     ECG rate:  90   ECG rate assessment: normal     Rhythm: sinus rhythm     Ectopy: none     Conduction: normal   .Critical Care  Performed by: Hinda Kehr, MD Authorized by: Hinda Kehr, MD   Critical care provider statement:    Critical care time (minutes):  30   Critical care time was exclusive of:  Separately billable procedures and treating other patients   Critical care was necessary to treat or prevent imminent or life-threatening deterioration of the following conditions:  Respiratory failure   Critical care was time spent personally by me on the following activities:  Development of treatment plan with patient or surrogate, evaluation of patient's response to treatment, examination of patient, obtaining history from patient or surrogate, ordering and performing treatments and interventions, ordering and review of laboratory studies, ordering and review of radiographic studies, pulse oximetry, re-evaluation of patient's condition and review of old charts    MEDICATIONS ORDERED IN ED: Medications  nitroGLYCERIN (NITROGLYN) 2 % ointment 1 inch (1 inch Topical Not Given 05/24/22 0718)     IMPRESSION / MDM / [place] / ED COURSE  I reviewed the triage vital signs and the nursing notes.                               Differential diagnosis includes, but is not limited to, new onset CHF, pulmonary edema, hypertensive emergency, ACS, PE, viral infection, pneumonia, pneumothorax.  Patient's presentation is most consistent with acute presentation with potential threat to life or bodily function.  Lab/studies ordered: EKG, cardiac monitoring, 1 view chest x-ray, respiratory viral panel, VBG, CBC with differential, pro time-INR, single blood culture, lactic acid, lipase, BNP, high-sensitivity troponin, CMP.  Patient's blood pressure initially according to EMS was more than  889 systolic and more than 169 diastolic.  They placed 1 inch of nitroglycerin paste on her chest.  Her blood pressure is now improving.  EKG is somewhat abnormal but likely consistent with demand ischemia in the setting of respiratory failure.  Does not meet STEMI criteria.  Lab work is all essentially normal other than a very slight elevation of her high-sensitivity troponin which is to be expected under the circumstances.  She has a BNP of 530 which is likely supportive of a diagnosis of new onset heart failure.  Her lactic acid is 2.0 which I believe is not representative of sepsis but rather reflects acute respiratory failure with hypoxia.  I ordered BiPAP as soon as patient arrived and she is tolerating it well.  I consulted the hospitalist team for admission and discussed the case in person with Dr. Dione Plover with the hospitalist service.  He will admit the patient.  The patient is on the cardiac monitor to evaluate for evidence of arrhythmia and/or significant heart rate changes.   Clinical Course as of 05/24/22 0852  Sat May 24, 2022  0714 Acutely started feeling sob suddenly yesterday. Hypoxic to 80s with EMS and started on CPAP.  BP 200/100 - on nitro paste.  CXR w/ new CHF. Lasix given.  [SM]    Clinical Course User Index [SM] Nathaniel Man, MD     FINAL CLINICAL IMPRESSION(S) / ED DIAGNOSES   Final diagnoses:   Acute respiratory failure with hypoxia ([place])  New onset of congestive heart failure ([place])  Acute pulmonary edema ([place])     Rx / DC Orders   ED Discharge Orders     None        Note:  This document was prepared using Dragon voice recognition software and may include unintentional dictation errors.   Hinda Kehr, MD 05/24/22 [phone]

## 2022-05-24 NOTE — ED Notes (Signed)
Cardiology at bedside. Family at bedside states pt has not ate. RN informed family she has been NPO and once the diet order is changed by the doctor the RN can order a dietary tray for patient. Cardiology states she will change the order. MD Dione Plover also notified.

## 2022-05-25 DIAGNOSIS — I214 Non-ST elevation (NSTEMI) myocardial infarction: Secondary | ICD-10-CM

## 2022-05-25 LAB — BASIC METABOLIC PANEL
Anion gap: 8 (ref 5–15)
BUN: 22 mg/dL (ref 8–23)
CO2: 23 mmol/L (ref 22–32)
Calcium: 7.8 mg/dL — ABNORMAL LOW (ref 8.9–10.3)
Chloride: 110 mmol/L (ref 98–111)
Creatinine, Ser: 0.99 mg/dL (ref 0.44–1.00)
GFR, Estimated: 54 mL/min — ABNORMAL LOW (ref >60)
Glucose, Bld: 136 mg/dL — ABNORMAL HIGH (ref 70–99)
Potassium: 3.6 mmol/L (ref 3.5–5.1)
Sodium: 141 mmol/L (ref 135–145)

## 2022-05-25 LAB — TROPONIN I (HIGH SENSITIVITY)
Troponin I (High Sensitivity): 470 ng/L (ref <18)
Troponin I (High Sensitivity): 388 ng/L (ref <18)
Troponin I (High Sensitivity): 390 ng/L (ref <18)

## 2022-05-25 LAB — CBC
HCT: 31.6 % — ABNORMAL LOW (ref 36.0–46.0)
Hemoglobin: 9.9 g/dL — ABNORMAL LOW (ref 12.0–15.0)
MCH: 27.8 pg (ref 26.0–34.0)
MCHC: 31.3 g/dL (ref 30.0–36.0)
MCV: 88.8 fL (ref 80.0–100.0)
Platelets: 160 10*3/uL (ref 150–400)
RBC: 3.56 MIL/uL — ABNORMAL LOW (ref 3.87–5.11)
RDW: 15.5 % (ref 11.5–15.5)
WBC: 3.7 10*3/uL — ABNORMAL LOW (ref 4.0–10.5)
nRBC: 0 % (ref 0.0–0.2)

## 2022-05-25 LAB — HEPARIN LEVEL (UNFRACTIONATED)
Heparin Unfractionated: 0.28 IU/mL — ABNORMAL LOW (ref 0.30–0.70)
Heparin Unfractionated: 0.3 IU/mL (ref 0.30–0.70)
Heparin Unfractionated: 0.36 IU/mL (ref 0.30–0.70)

## 2022-05-25 MED ORDER — HEPARIN BOLUS VIA INFUSION
1000.0000 [IU] | Freq: Once | INTRAVENOUS | Status: AC
  Administered 2022-05-25: 1000 [IU] via INTRAVENOUS
  Filled 2022-05-25: qty 1000

## 2022-05-25 NOTE — Progress Notes (Signed)
ANTICOAGULATION CONSULT NOTE -   Pharmacy Consult for Heparin Drip Indication: chest pain/ACS  Allergies  Allergen Reactions   Aspirin Other (See Comments)    Pt states she has metal clips and was told not to take asa   Butalbital-Apap-Caffeine Other (See Comments)   Ciprofloxacin Other (See Comments)   Diazepam Other (See Comments)   Ibuprofen Other (See Comments)   Lovastatin Other (See Comments)    Other reaction(s): Unknown   Other     Other reaction(s): Unknown   Propoxyphene Other (See Comments)   Statins     Other reaction(s): Muscle Pain Other reaction(s): Muscle Pain   Sulfa Antibiotics Other (See Comments)   Tapentadol Diarrhea    Patient Measurements: Height: '5\' 4"'$  (162.6 cm) Weight: 69.9 kg (154 lb) IBW/kg (Calculated) : 54.7 Heparin Dosing Weight: 68.8 kg  Vital Signs: Temp: 97.8 F (36.6 C) (01/07 1600) BP: 132/74 (01/07 2000) Pulse Rate: 72 (01/07 2000)  Labs: Recent Labs    05/24/22 6195 05/24/22 0949 05/24/22 1836 05/25/22 0318 05/25/22 0511 05/25/22 0807 05/25/22 1151 05/25/22 1456 05/25/22 2100  HGB 11.8*  --   --   --  9.9*  --   --   --   --   HCT 37.7  --   --   --  31.6*  --   --   --   --   PLT 173  --   --   --  160  --   --   --   --   LABPROT 14.0  --   --   --   --   --   --   --   --   INR 1.1  --   --   --   --   --   --   --   --   HEPARINUNFRC  --   --   --  0.28*  --   --  0.30  --  0.36  CREATININE 0.82  --   --   --  0.99  --   --   --   --   TROPONINIHS 30*   < > 1,232*  --   --  470*  --  388*  --    < > = values in this interval not displayed.     Estimated Creatinine Clearance: 36.3 mL/min (by C-G formula based on SCr of 0.99 mg/dL).   Medical History: Past Medical History:  Diagnosis Date   Arthritis    Chronic diarrhea    Diabetes mellitus without complication ([place])    Gout    Hypercholesteremia    Hypertension    Spastic colon    Assessment: Patient is a 89yo female presenting with shortness of  breath. Troponins trending upward. Pharmacy consulted for Heparin dosing. No prior anticoagulants noted in history.  1/7 0318 HL=0.28, SUBtherapeutic  1/7 1151 HL=0.30 therapeutic, borderline, inc to 950 u/hr 1/7 2100 HL=0.36 therapeutic  Goal of Therapy:  Heparin level 0.3-0.7 units/ml Monitor platelets by anticoagulation protocol: Yes   Plan:  Continue heparin drip at 950 units/hr.  Will recheck HL in 8 hrs for confirmatory level.  CBC daily  Lorin Picket, PharmD 05/25/2022 9:28 PM

## 2022-05-25 NOTE — Progress Notes (Signed)
PROGRESS NOTE  Dana Green ION:[id number] DOB: May 09, 1930 DOA: 05/24/2022 PCP: Tracie Harrier, MD  Hospital Course/Subjective: Dana Green is a 89 y.o. female with medical history significant for rheumatoid arthritis, diffuse OA, GAD, type 2 diabetes with polyneuropathy, unstable angina, hypertension, who presents with shortness of breath and was found to have NSTEMI. She presented with complaints of hypertension and hypoxic respiratory failure that has improved; she was started on ASA and Heparin gtt overnight and is now resting comfortably and free of symptoms.   Assessment/Plan:  Principal Problem:   NSTEMI (non-ST elevated myocardial infarction) ([place]) Active Problems:   Diabetes ([place])   Essential hypertension   Unstable angina ([place])   Acute hypoxemic respiratory failure ([place])   Known medical problems   Assessment and Plan: * NSTEMI and Acute hypoxemic respiratory failure ([place]) Patient presenting with acute shortness of breath and chest tightness as well as reportedly extremely elevated blood pressures which have improved.  Possible etiology of presentation include flash pulmonary edema secondary to elevated blood pressures which is now seem to mostly resolved, unstable angina given recurrence of symptoms over 2 nights.  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 - Cardiology consulted, recommend heparin and awaiting further recs - Trend troponin to peak  Hypertension-continue amlodipine, Imdur, metoprolol  Gout-continue allopurinol  RA-continue leflunomide  GERD-continue PPI  DM2-hold glipizide and metformin, Sliding scale correction  DVT Prophylaxis: Hep gtt  Code Status: FULL   Family Communication: Discussed with patient and her brother in the ER this AM.   Disposition Plan: Home when medically stable.   Consultants: Cardiology    Procedures: None   Antimicrobials: Anti-infectives (From admission, onward)    None       Objective: Vitals:   05/25/22 0115 05/25/22 0200 05/25/22 0330 05/25/22 0500  BP: (!) 157/59 (!) 151/65 (!) 153/59 (!) 150/62  Pulse: 75 65 73 71  Resp: '20 16 17 19  '$ Temp:    97.8 F (36.6 C)  TempSrc:    Oral  SpO2: 98% 95% 97% 97%  Weight:      Height:        Intake/Output Summary (Last 24 hours) at 05/25/2022 0747 Last data filed at 05/25/2022 0128 Gross per 24 hour  Intake 103.63 ml  Output --  Net 103.63 ml   Filed Weights   05/24/22 0635  Weight: 69.9 kg   Exam: General:  Alert, oriented, calm, in no acute distress, looks younger than her stated age Eyes: EOMI, clear sclerea Neck: supple, no masses, trachea mildline  Cardiovascular: RRR, no murmurs or rubs, no peripheral edema  Respiratory: clear to auscultation bilaterally, no wheezes, no crackles  Abdomen: soft, nontender, nondistended, normal bowel tones heard  Skin: dry, no rashes  Musculoskeletal: no joint effusions, normal range of motion  Psychiatric: appropriate affect, normal speech  Neurologic: extraocular muscles intact, clear speech, moving all extremities with intact sensorium   Data Reviewed: CBC: Recent Labs  Lab 05/24/22 0637 05/25/22 0511  WBC 5.9 3.7*  NEUTROABS 4.0  --   HGB 11.8* 9.9*  HCT 37.7 31.6*  MCV 89.3 88.8  PLT 173 244   Basic Metabolic Panel: Recent Labs  Lab 05/24/22 0637 05/25/22 0511  NA 141 141  K 3.7 3.6  CL 109 110  CO2 22 23  GLUCOSE 229* 136*  BUN 21 22  CREATININE 0.82 0.99  CALCIUM  8.0* 7.8*   GFR: Estimated Creatinine Clearance: 36.3 mL/min (by C-G formula based on SCr of 0.99 mg/dL). Liver Function Tests: Recent Labs  Lab 05/24/22 0637  AST 21  ALT 12  ALKPHOS 49  BILITOT 1.0  PROT 6.3*  ALBUMIN 3.5   Recent Labs  Lab 05/24/22 0637  LIPASE 28   No results for input(s): "AMMONIA" in the last 168 hours. Coagulation Profile: Recent Labs   Lab 05/24/22 0637  INR 1.1   Cardiac Enzymes: No results for input(s): "CKTOTAL", "CKMB", "CKMBINDEX", "TROPONINI" in the last 168 hours. BNP (last 3 results) No results for input(s): "PROBNP" in the last 8760 hours. HbA1C: No results for input(s): "HGBA1C" in the last 72 hours. CBG: No results for input(s): "GLUCAP" in the last 168 hours. Lipid Profile: No results for input(s): "CHOL", "HDL", "LDLCALC", "TRIG", "CHOLHDL", "LDLDIRECT" in the last 72 hours. Thyroid Function Tests: No results for input(s): "TSH", "T4TOTAL", "FREET4", "T3FREE", "THYROIDAB" in the last 72 hours. Anemia Panel: No results for input(s): "VITAMINB12", "FOLATE", "FERRITIN", "TIBC", "IRON", "RETICCTPCT" in the last 72 hours. Urine analysis:    Component Value Date/Time   COLORURINE Yellow 07/24/2011 1053   APPEARANCEUR Hazy 07/24/2011 1053   LABSPEC 1.021 07/24/2011 1053   PHURINE 5.0 07/24/2011 1053   GLUCOSEU 150 mg/dL 07/24/2011 1053   HGBUR Negative 07/24/2011 1053   BILIRUBINUR Negative 07/24/2011 1053   KETONESUR Negative 07/24/2011 1053   PROTEINUR Negative 07/24/2011 1053   NITRITE Negative 07/24/2011 1053   LEUKOCYTESUR Trace 07/24/2011 1053   Sepsis Labs: '@LABRCNTIP'$ (KGMWNUUVOZDGU:4,QIHKVQQVZDG:3)  ) Recent Results (from the past 240 hour(s))  Resp panel by RT-PCR (RSV, Flu A&B, Covid) Anterior Nasal Swab     Status: None   Collection Time: 05/24/22  6:36 AM   Specimen: Anterior Nasal Swab  Result Value Ref Range Status   SARS Coronavirus 2 by RT PCR NEGATIVE NEGATIVE Final    Comment: (NOTE) SARS-CoV-2 target nucleic acids are NOT DETECTED.  The SARS-CoV-2 RNA is generally detectable in upper respiratory specimens during the acute phase of infection. The lowest concentration of SARS-CoV-2 viral copies this assay can detect is 138 copies/mL. A negative result does not preclude SARS-Cov-2 infection and should not be used as the sole basis for treatment or other patient management  decisions. A negative result may occur with  improper specimen collection/handling, submission of specimen other than nasopharyngeal swab, presence of viral mutation(s) within the areas targeted by this assay, and inadequate number of viral copies(<138 copies/mL). A negative result must be combined with clinical observations, patient history, and epidemiological information. The expected result is Negative.  Fact Sheet for Patients:  EntrepreneurPulse.com.au  Fact Sheet for Healthcare Providers:  IncredibleEmployment.be  This test is no t yet approved or cleared by the [place] FDA and  has been authorized for detection and/or diagnosis of SARS-CoV-2 by FDA under an Emergency Use Authorization (EUA). This EUA will remain  in effect (meaning this test can be used) for the duration of the COVID-19 declaration under Section 564(b)(1) of the Act, 21 U.S.C.section 360bbb-3(b)(1), unless the authorization is terminated  or revoked sooner.       Influenza A by PCR NEGATIVE NEGATIVE Final   Influenza B by PCR NEGATIVE NEGATIVE Final    Comment: (NOTE) The Xpert Xpress SARS-CoV-2/FLU/RSV plus assay is intended as an aid in the diagnosis of influenza from Nasopharyngeal swab specimens and should not be used as a sole basis for treatment. Nasal washings and aspirates are unacceptable for Xpert Xpress SARS-CoV-2/FLU/RSV  testing.  Fact Sheet for Patients: EntrepreneurPulse.com.au  Fact Sheet for Healthcare Providers: IncredibleEmployment.be  This test is not yet approved or cleared by the [place] FDA and has been authorized for detection and/or diagnosis of SARS-CoV-2 by FDA under an Emergency Use Authorization (EUA). This EUA will remain in effect (meaning this test can be used) for the duration of the COVID-19 declaration under Section 564(b)(1) of the Act, 21 U.S.C. section 360bbb-3(b)(1), unless the  authorization is terminated or revoked.     Resp Syncytial Virus by PCR NEGATIVE NEGATIVE Final    Comment: (NOTE) Fact Sheet for Patients: EntrepreneurPulse.com.au  Fact Sheet for Healthcare Providers: IncredibleEmployment.be  This test is not yet approved or cleared by the [place] FDA and has been authorized for detection and/or diagnosis of SARS-CoV-2 by FDA under an Emergency Use Authorization (EUA). This EUA will remain in effect (meaning this test can be used) for the duration of the COVID-19 declaration under Section 564(b)(1) of the Act, 21 U.S.C. section 360bbb-3(b)(1), unless the authorization is terminated or revoked.  Performed at [facility], [address] [place], [place] [address]   Blood culture (routine single)     Status: None (Preliminary result)   Collection Time: 05/24/22  6:37 AM   Specimen: BLOOD  Result Value Ref Range Status   Specimen Description BLOOD BLOOD RIGHT ARM  Final   Special Requests   Final    BOTTLES DRAWN AEROBIC AND ANAEROBIC Blood Culture adequate volume   Culture   Final    NO GROWTH < 24 HOURS Performed at [facility], [address] [place],  [address]    Report Status PENDING  Incomplete     Studies: No results found.  Scheduled Meds:  allopurinol  100 mg Oral BID   amLODipine  2.5 mg Oral Daily   isosorbide mononitrate  30 mg Oral Daily   leflunomide  20 mg Oral Daily   metoprolol succinate  25 mg Oral Daily   pantoprazole  40 mg Oral Daily   sodium chloride flush  3 mL Intravenous Q12H    Continuous Infusions:  heparin 900 Units/hr (05/25/22 0352)     LOS: 1 day   Time spent: 31 minutes  Jannifer Fischler Marry Guan, MD [facility] Pager [phone]  If 7PM-7AM, please contact night-coverage [email or web address] Password [facility] 05/25/2022, 7:47 AM

## 2022-05-25 NOTE — ED Notes (Signed)
Pt able to ambulate with minimal assistance to toilet in room. Pt did have urine output and returned to bed again with minimal assistance. Pt resting comfortably in bed at this time.

## 2022-05-25 NOTE — Progress Notes (Signed)
SUBJECTIVE: Patient is a 89 year old female with past medical history significant for DM, HTN, RA, hyperlipidemia, GERD, with EF of 40-45% on echo 12/2017. Patient presented to ED via EMS on 05/24/21 with chief complaint of acute onset severe shortness of breath. Patient reports waking up early in the morning the past two days, with sudden onset shortness of breath.     Vitals:   05/25/22 0330 05/25/22 0500 05/25/22 1000 05/25/22 1047  BP: (!) 153/59 (!) 150/62 (!) 150/59 (!) 150/59  Pulse: 73 71 74 72  Resp: '17 19 20   '$ Temp:  97.8 F (36.6 C) 97.7 F (36.5 C)   TempSrc:  Oral    SpO2: 97% 97% 97%   Weight:      Height:        Intake/Output Summary (Last 24 hours) at 05/25/2022 1358 Last data filed at 05/25/2022 0128 Gross per 24 hour  Intake 103.63 ml  Output --  Net 103.63 ml    LABS: Basic Metabolic Panel: Recent Labs    05/24/22 0637 05/25/22 0511  NA 141 141  K 3.7 3.6  CL 109 110  CO2 22 23  GLUCOSE 229* 136*  BUN 21 22  CREATININE 0.82 0.99  CALCIUM 8.0* 7.8*   Liver Function Tests: Recent Labs    05/24/22 0637  AST 21  ALT 12  ALKPHOS 49  BILITOT 1.0  PROT 6.3*  ALBUMIN 3.5   Recent Labs    05/24/22 0637  LIPASE 28   CBC: Recent Labs    05/24/22 0637 05/25/22 0511  WBC 5.9 3.7*  NEUTROABS 4.0  --   HGB 11.8* 9.9*  HCT 37.7 31.6*  MCV 89.3 88.8  PLT 173 160   Cardiac Enzymes: No results for input(s): "CKTOTAL", "CKMB", "CKMBINDEX", "TROPONINI" in the last 72 hours. BNP: Invalid input(s): "POCBNP" D-Dimer: No results for input(s): "DDIMER" in the last 72 hours. Hemoglobin A1C: No results for input(s): "HGBA1C" in the last 72 hours. Fasting Lipid Panel: No results for input(s): "CHOL", "HDL", "LDLCALC", "TRIG", "CHOLHDL", "LDLDIRECT" in the last 72 hours. Thyroid Function Tests: No results for input(s): "TSH", "T4TOTAL", "T3FREE", "THYROIDAB" in the last 72 hours.  Invalid input(s): "FREET3" Anemia Panel: No results for input(s):  "VITAMINB12", "FOLATE", "FERRITIN", "TIBC", "IRON", "RETICCTPCT" in the last 72 hours.   PHYSICAL EXAM General: Well developed, well nourished, in no acute distress HEENT:  Normocephalic and atramatic Neck:  No JVD.  Lungs: Clear bilaterally to auscultation and percussion. Heart: HRRR . Normal S1 and S2 without gallops or murmurs.  Abdomen: Bowel sounds are positive, abdomen soft and non-tender  Msk:  Back normal, normal gait. Normal strength and tone for age. Extremities: No clubbing, cyanosis or edema.   Neuro: Alert and oriented X 3. Psych:  Good affect, responds appropriately  TELEMETRY: sinus rhythm, HR 74 bpm  ASSESSMENT AND PLAN: Patient sitting on side of bed. Denies chest pain. Reports episode of worsening shortness of breath over night, continues to be short of breath. Troponin levels 30>524>1010>1181>1232>470. Will make patient NPO after midnight tonight for possible cath tomorrow. Patient and daughter requesting [facility] cardiology for heart cath. Will continue to follow.   Principal Problem:   NSTEMI (non-ST elevated myocardial infarction) [facility]) Active Problems:   Diabetes [facility])   Essential hypertension   Unstable angina ([place])   Acute hypoxemic respiratory failure ([place])   Known medical problems    Baylin Cabal, FNP-C 05/25/2022 1:58 PM

## 2022-05-25 NOTE — Progress Notes (Signed)
ANTICOAGULATION CONSULT NOTE - Initial Consult  Pharmacy Consult for Heparin Drip Indication: chest pain/ACS  Allergies  Allergen Reactions   Aspirin Other (See Comments)    Pt states she has metal clips and was told not to take asa   Butalbital-Apap-Caffeine Other (See Comments)   Ciprofloxacin Other (See Comments)   Diazepam Other (See Comments)   Ibuprofen Other (See Comments)   Lovastatin Other (See Comments)    Other reaction(s): Unknown   Other     Other reaction(s): Unknown   Propoxyphene Other (See Comments)   Statins     Other reaction(s): Muscle Pain Other reaction(s): Muscle Pain   Sulfa Antibiotics Other (See Comments)   Tapentadol Diarrhea    Patient Measurements: Height: '5\' 4"'$  (162.6 cm) Weight: 69.9 kg (154 lb) IBW/kg (Calculated) : 54.7 Heparin Dosing Weight: 68.8 kg  Vital Signs: Temp: 98.4 F (36.9 C) (01/06 2156) Temp Source: Oral (01/06 2156) BP: 153/59 (01/07 0330) Pulse Rate: 73 (01/07 0330)  Labs: Recent Labs    05/24/22 0637 05/24/22 0949 05/24/22 1246 05/24/22 1836 05/25/22 0318  HGB 11.8*  --   --   --   --   HCT 37.7  --   --   --   --   PLT 173  --   --   --   --   LABPROT 14.0  --   --   --   --   INR 1.1  --   --   --   --   HEPARINUNFRC  --   --   --   --  0.28*  CREATININE 0.82  --   --   --   --   TROPONINIHS 30* 524* 1,010*  1,181* 1,232*  --      Estimated Creatinine Clearance: 43.8 mL/min (by C-G formula based on SCr of 0.82 mg/dL).   Medical History: Past Medical History:  Diagnosis Date   Arthritis    Chronic diarrhea    Diabetes mellitus without complication ([place])    Gout    Hypercholesteremia    Hypertension    Spastic colon    Assessment: Patient is a 89yo female presenting with shortness of breath. Troponins trending upward. Pharmacy consulted for Heparin dosing. No prior anticoagulants noted in history.  Goal of Therapy:  Heparin level 0.3-0.7 units/ml Monitor platelets by anticoagulation protocol:  Yes   Plan:  1/7:  HL @ 0318 = 0.28, SUBtherapeutic  Will order heparin 1000 units IV X 1 bolus and increase drip rate to 900 units/hr.  Will recheck HL 8 hrs after rate change.   Winnie Barsky D 05/25/2022 3:50 AM

## 2022-05-25 NOTE — Progress Notes (Signed)
ANTICOAGULATION CONSULT NOTE -   Pharmacy Consult for Heparin Drip Indication: chest pain/ACS  Allergies  Allergen Reactions   Aspirin Other (See Comments)    Pt states she has metal clips and was told not to take asa   Butalbital-Apap-Caffeine Other (See Comments)   Ciprofloxacin Other (See Comments)   Diazepam Other (See Comments)   Ibuprofen Other (See Comments)   Lovastatin Other (See Comments)    Other reaction(s): Unknown   Other     Other reaction(s): Unknown   Propoxyphene Other (See Comments)   Statins     Other reaction(s): Muscle Pain Other reaction(s): Muscle Pain   Sulfa Antibiotics Other (See Comments)   Tapentadol Diarrhea    Patient Measurements: Height: '5\' 4"'$  (162.6 cm) Weight: 69.9 kg (154 lb) IBW/kg (Calculated) : 54.7 Heparin Dosing Weight: 68.8 kg  Vital Signs: Temp: 97.7 F (36.5 C) (01/07 1000) Temp Source: Oral (01/07 0500) BP: 150/59 (01/07 1047) Pulse Rate: 72 (01/07 1047)  Labs: Recent Labs    05/24/22 0637 05/24/22 0949 05/24/22 1246 05/24/22 1836 05/25/22 0318 05/25/22 0511 05/25/22 0807 05/25/22 1151  HGB 11.8*  --   --   --   --  9.9*  --   --   HCT 37.7  --   --   --   --  31.6*  --   --   PLT 173  --   --   --   --  160  --   --   LABPROT 14.0  --   --   --   --   --   --   --   INR 1.1  --   --   --   --   --   --   --   HEPARINUNFRC  --   --   --   --  0.28*  --   --  0.30  CREATININE 0.82  --   --   --   --  0.99  --   --   TROPONINIHS 30*   < > 1,010*  1,181* 1,232*  --   --  470*  --    < > = values in this interval not displayed.     Estimated Creatinine Clearance: 36.3 mL/min (by C-G formula based on SCr of 0.99 mg/dL).   Medical History: Past Medical History:  Diagnosis Date   Arthritis    Chronic diarrhea    Diabetes mellitus without complication ([place])    Gout    Hypercholesteremia    Hypertension    Spastic colon    Assessment: Patient is a 89yo female presenting with shortness of breath. Troponins  trending upward. Pharmacy consulted for Heparin dosing. No prior anticoagulants noted in history.  1/7 0318 HL=0.28, SUBtherapeutic  1/7 1151 HL=0.30 therapeutic, borderline, inc to 950 u/hr  Goal of Therapy:  Heparin level 0.3-0.7 units/ml Monitor platelets by anticoagulation protocol: Yes   Plan:  1/7 1151 HL=0.30 therapeutic, borderline Will slightly increase heparin drip rate to 950 units/hr.  Will recheck HL 8 hrs after rate change.  CBC daily  Tiny Chaudhary A 05/25/2022 12:40 PM

## 2022-05-26 ENCOUNTER — Encounter: Payer: Self-pay | Admitting: Family Medicine

## 2022-05-26 DIAGNOSIS — I214 Non-ST elevation (NSTEMI) myocardial infarction: Secondary | ICD-10-CM | POA: Diagnosis not present

## 2022-05-26 LAB — GLUCOSE, CAPILLARY
Glucose-Capillary: 126 mg/dL — ABNORMAL HIGH (ref 70–99)
Glucose-Capillary: 155 mg/dL — ABNORMAL HIGH (ref 70–99)

## 2022-05-26 LAB — CBC
HCT: 36 % (ref 36.0–46.0)
Hemoglobin: 11.5 g/dL — ABNORMAL LOW (ref 12.0–15.0)
MCH: 27.6 pg (ref 26.0–34.0)
MCHC: 31.9 g/dL (ref 30.0–36.0)
MCV: 86.5 fL (ref 80.0–100.0)
Platelets: 177 10*3/uL (ref 150–400)
RBC: 4.16 MIL/uL (ref 3.87–5.11)
RDW: 15.7 % — ABNORMAL HIGH (ref 11.5–15.5)
WBC: 5 10*3/uL (ref 4.0–10.5)
nRBC: 0 % (ref 0.0–0.2)

## 2022-05-26 LAB — HEPARIN LEVEL (UNFRACTIONATED): Heparin Unfractionated: 0.45 IU/mL (ref 0.30–0.70)

## 2022-05-26 MED ORDER — FUROSEMIDE 10 MG/ML IJ SOLN
20.0000 mg | Freq: Once | INTRAMUSCULAR | Status: AC
  Administered 2022-05-26: 20 mg via INTRAVENOUS
  Filled 2022-05-26: qty 2

## 2022-05-26 MED ORDER — INSULIN ASPART 100 UNIT/ML IJ SOLN
0.0000 [IU] | Freq: Three times a day (TID) | INTRAMUSCULAR | Status: DC
  Administered 2022-05-26 – 2022-05-27 (×2): 1 [IU] via SUBCUTANEOUS
  Administered 2022-05-27: 2 [IU] via SUBCUTANEOUS
  Filled 2022-05-26 (×3): qty 1

## 2022-05-26 MED ORDER — LOSARTAN POTASSIUM 25 MG PO TABS
25.0000 mg | ORAL_TABLET | Freq: Two times a day (BID) | ORAL | Status: DC
  Administered 2022-05-26 – 2022-05-27 (×2): 25 mg via ORAL
  Filled 2022-05-26 (×2): qty 1

## 2022-05-26 MED ORDER — FUROSEMIDE 20 MG PO TABS
20.0000 mg | ORAL_TABLET | Freq: Every day | ORAL | Status: DC
  Administered 2022-05-27: 20 mg via ORAL
  Filled 2022-05-26: qty 1

## 2022-05-26 MED ORDER — METOPROLOL TARTRATE 25 MG PO TABS
25.0000 mg | ORAL_TABLET | Freq: Two times a day (BID) | ORAL | Status: DC
  Administered 2022-05-26 – 2022-05-27 (×2): 25 mg via ORAL
  Filled 2022-05-26 (×2): qty 1

## 2022-05-26 NOTE — Progress Notes (Signed)
PROGRESS NOTE  Salisha Bardsley SWF:[id number] DOB: [birth year] DOA: 05/24/2022 PCP: Tracie Harrier, MD  Hospital Course/Subjective: Dana Green is a 89 y.o. female with medical history significant for rheumatoid arthritis, diffuse OA, GAD, type 2 diabetes with polyneuropathy, unstable angina, hypertension, who presents with shortness of breath and was found to have NSTEMI. She presented with complaints of hypertension and hypoxic respiratory failure that has improved; she was started on ASA and Heparin gtt after admission and is now resting comfortably and free of symptoms.   Seen this AM with her daughter, she has been chest pain free and remains on Heparin drip. Is NPO this AM for anticipated heart cath.  Assessment/Plan:  Principal Problem:   NSTEMI (non-ST elevated myocardial infarction) ([place]) Active Problems:   Diabetes ([place])   Essential hypertension   Unstable angina ([place])   Acute hypoxemic respiratory failure ([place])   Known medical problems   Assessment and Plan: * NSTEMI and Acute hypoxemic respiratory failure ([place]) Patient presenting with acute shortness of breath and chest tightness as well as reportedly extremely elevated blood pressures which have improved.  Possible etiology of presentation include flash pulmonary edema secondary to elevated blood pressures which is now seem to mostly resolved, unstable angina given recurrence of symptoms over 2 nights.  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 - Cardiology consulted, recommend heparin and awaiting further recs - family waiting to discuss with cardiology but anticipate diagnostic cath today  Hypertension-continue amlodipine, Imdur, metoprolol  Gout-continue allopurinol  RA-continue leflunomide  GERD-continue PPI  DM2-hold glipizide and metformin, Sliding scale correction  DVT  Prophylaxis: Hep gtt  Code Status: FULL   Family Communication: Discussed with patient and her daughter on PCU this AM.   Disposition Plan: Home when medically stable.   Consultants: Cardiology   Procedures: None   Antimicrobials: Anti-infectives (From admission, onward)    None       Objective: Vitals:   05/25/22 2200 05/25/22 2330 05/25/22 2349 05/26/22 0840  BP: (!) 157/75 (!) 142/74 (!) 168/75 (!) 176/76  Pulse: 72 75 85 96  Resp: '17 17 18 20  '$ Temp:   97.8 F (36.6 C) 97.9 F (36.6 C)  TempSrc:   Oral Oral  SpO2: 97% 99% 94% 95%  Weight:      Height:        Intake/Output Summary (Last 24 hours) at 05/26/2022 1106 Last data filed at 05/26/2022 2202 Gross per 24 hour  Intake 253.47 ml  Output 1150 ml  Net -896.53 ml    Filed Weights   05/24/22 0635  Weight: 69.9 kg   Exam: General:  Alert, oriented, calm, in no acute distress, ambulating on room air  Eyes: EOMI, clear conjuctivae, white sclerea Neck: supple, no masses, trachea mildline  Cardiovascular: RRR, no murmurs or rubs, no peripheral edema  Respiratory: clear to auscultation bilaterally, no wheezes, no crackles  Abdomen: soft, nontender, nondistended, normal bowel tones heard  Skin: dry, no rashes  Musculoskeletal: no joint effusions, normal range of motion  Psychiatric: appropriate affect, normal speech  Neurologic: extraocular muscles intact, clear speech, moving all extremities with intact sensorium    Data Reviewed: CBC: Recent Labs  Lab 05/24/22 0637 05/25/22 0511 05/26/22 0531  WBC 5.9 3.7* 5.0  NEUTROABS 4.0  --   --   HGB 11.8* 9.9* 11.5*  HCT 37.7 31.6* 36.0  MCV 89.3 88.8 86.5  PLT 173 160 704    Basic Metabolic Panel: Recent Labs  Lab 05/24/22 0637 05/25/22 0511  NA 141 141  K 3.7 3.6  CL 109 110  CO2 22 23  GLUCOSE 229* 136*  BUN 21 22  CREATININE 0.82 0.99  CALCIUM 8.0* 7.8*    GFR: Estimated Creatinine Clearance: 36.3 mL/min (by C-G formula based on SCr of  0.99 mg/dL). Liver Function Tests: Recent Labs  Lab 05/24/22 0637  AST 21  ALT 12  ALKPHOS 49  BILITOT 1.0  PROT 6.3*  ALBUMIN 3.5    Recent Labs  Lab 05/24/22 0637  LIPASE 28    No results for input(s): "AMMONIA" in the last 168 hours. Coagulation Profile: Recent Labs  Lab 05/24/22 0637  INR 1.1    Cardiac Enzymes: No results for input(s): "CKTOTAL", "CKMB", "CKMBINDEX", "TROPONINI" in the last 168 hours. BNP (last 3 results) No results for input(s): "PROBNP" in the last 8760 hours. HbA1C: No results for input(s): "HGBA1C" in the last 72 hours. CBG: No results for input(s): "GLUCAP" in the last 168 hours. Lipid Profile: No results for input(s): "CHOL", "HDL", "LDLCALC", "TRIG", "CHOLHDL", "LDLDIRECT" in the last 72 hours. Thyroid Function Tests: No results for input(s): "TSH", "T4TOTAL", "FREET4", "T3FREE", "THYROIDAB" in the last 72 hours. Anemia Panel: No results for input(s): "VITAMINB12", "FOLATE", "FERRITIN", "TIBC", "IRON", "RETICCTPCT" in the last 72 hours. Urine analysis:    Component Value Date/Time   COLORURINE Yellow 07/24/2011 1053   APPEARANCEUR Hazy 07/24/2011 1053   LABSPEC 1.021 07/24/2011 1053   PHURINE 5.0 07/24/2011 1053   GLUCOSEU 150 mg/dL 07/24/2011 1053   HGBUR Negative 07/24/2011 1053   BILIRUBINUR Negative 07/24/2011 1053   KETONESUR Negative 07/24/2011 1053   PROTEINUR Negative 07/24/2011 1053   NITRITE Negative 07/24/2011 1053   LEUKOCYTESUR Trace 07/24/2011 1053   Sepsis Labs: '@LABRCNTIP'$ (procalcitonin:4,lacticidven:4)  ) Recent Results (from the past 240 hour(s))  Resp panel by RT-PCR (RSV, Flu A&B, Covid) Anterior Nasal Swab     Status: None   Collection Time: 05/24/22  6:36 AM   Specimen: Anterior Nasal Swab  Result Value Ref Range Status   SARS Coronavirus 2 by RT PCR NEGATIVE NEGATIVE Final    Comment: (NOTE) SARS-CoV-2 target nucleic acids are NOT DETECTED.  The SARS-CoV-2 RNA is generally detectable in upper  respiratory specimens during the acute phase of infection. The lowest concentration of SARS-CoV-2 viral copies this assay can detect is 138 copies/mL. A negative result does not preclude SARS-Cov-2 infection and should not be used as the sole basis for treatment or other patient management decisions. A negative result may occur with  improper specimen collection/handling, submission of specimen other than nasopharyngeal swab, presence of viral mutation(s) within the areas targeted by this assay, and inadequate number of viral copies(<138 copies/mL). A negative result must be combined with clinical observations, patient history, and epidemiological information. The expected result is Negative.  Fact Sheet for Patients:  EntrepreneurPulse.com.au  Fact Sheet for Healthcare Providers:  IncredibleEmployment.be  This test is no t yet approved or cleared by the [place] FDA and  has been authorized for detection and/or diagnosis of SARS-CoV-2 by FDA under an Emergency Use Authorization (EUA). This EUA will remain  in effect (meaning this test can be used) for the duration of the COVID-19 declaration under Section 564(b)(1) of the Act, 21 U.S.C.section 360bbb-3(b)(1), unless the authorization is terminated  or revoked sooner.       Influenza A by PCR NEGATIVE  NEGATIVE Final   Influenza B by PCR NEGATIVE NEGATIVE Final    Comment: (NOTE) The Xpert Xpress SARS-CoV-2/FLU/RSV plus assay is intended as an aid in the diagnosis of influenza from Nasopharyngeal swab specimens and should not be used as a sole basis for treatment. Nasal washings and aspirates are unacceptable for Xpert Xpress SARS-CoV-2/FLU/RSV testing.  Fact Sheet for Patients: EntrepreneurPulse.com.au  Fact Sheet for Healthcare Providers: IncredibleEmployment.be  This test is not yet approved or cleared by the [place] FDA and has been  authorized for detection and/or diagnosis of SARS-CoV-2 by FDA under an Emergency Use Authorization (EUA). This EUA will remain in effect (meaning this test can be used) for the duration of the COVID-19 declaration under Section 564(b)(1) of the Act, 21 U.S.C. section 360bbb-3(b)(1), unless the authorization is terminated or revoked.     Resp Syncytial Virus by PCR NEGATIVE NEGATIVE Final    Comment: (NOTE) Fact Sheet for Patients: EntrepreneurPulse.com.au  Fact Sheet for Healthcare Providers: IncredibleEmployment.be  This test is not yet approved or cleared by the [place] FDA and has been authorized for detection and/or diagnosis of SARS-CoV-2 by FDA under an Emergency Use Authorization (EUA). This EUA will remain in effect (meaning this test can be used) for the duration of the COVID-19 declaration under Section 564(b)(1) of the Act, 21 U.S.C. section 360bbb-3(b)(1), unless the authorization is terminated or revoked.  Performed at [facility], [address] [place], [place] [address]   Blood culture (routine single)     Status: None (Preliminary result)   Collection Time: 05/24/22  6:37 AM   Specimen: BLOOD  Result Value Ref Range Status   Specimen Description BLOOD BLOOD RIGHT ARM  Final   Special Requests   Final    BOTTLES DRAWN AEROBIC AND ANAEROBIC Blood Culture adequate volume   Culture   Final    NO GROWTH 2 DAYS Performed at [facility], [address] [place],  [address]    Report Status PENDING  Incomplete     Studies: No results found.  Scheduled Meds:  allopurinol  100 mg Oral BID   amLODipine  2.5 mg Oral Daily   isosorbide mononitrate  30 mg Oral Daily   leflunomide  20 mg Oral Daily   metoprolol succinate  25 mg Oral Daily   pantoprazole  40 mg Oral Daily   sodium chloride flush  3 mL Intravenous Q12H    Continuous Infusions:  heparin 950 Units/hr (05/26/22 0505)      LOS: 2 days   Time spent: 31 minutes  Kaydon Husby Marry Guan, MD [facility] Pager [phone]  If 7PM-7AM, please contact night-coverage [email or web address] Password [facility] 05/26/2022, 11:06 AM

## 2022-05-26 NOTE — Progress Notes (Signed)
[facility] Cardiology    SUBJECTIVE: Improved dyspnea shortness of breath complaining of previous PND orthopnea no leg edema but has had some hand swelling denies any chest pain but has had difficulty getting a deep breath no fever chills or sweats   Vitals:   05/25/22 2330 05/25/22 2349 05/26/22 0840 05/26/22 1204  BP: (!) 142/74 (!) 168/75 (!) 176/76 (!) 144/58  Pulse: 75 85 96 73  Resp: '17 18 20 18  '$ Temp:  97.8 F (36.6 C) 97.9 F (36.6 C) 97.8 F (36.6 C)  TempSrc:  Oral Oral Oral  SpO2: 99% 94% 95% 96%  Weight:      Height:         Intake/Output Summary (Last 24 hours) at 05/26/2022 1317 Last data filed at 05/26/2022 1017 Gross per 24 hour  Intake 253.47 ml  Output 1150 ml  Net -896.53 ml      PHYSICAL EXAM  General: Well developed, well nourished, in no acute distress HEENT:  Normocephalic and atramatic Neck:  No JVD.  Lungs: Diminished bilaterally to auscultation and percussion. Heart: HRRR . Normal S1 and S2 without gallops or murmurs.  Abdomen: Bowel sounds are positive, abdomen soft and non-tender  Msk:  Back normal, normal gait. Normal strength and tone for age. Extremities: No clubbing, cyanosis or edema.   Neuro: Alert and oriented X 3. Psych:  Good affect, responds appropriately   LABS: Basic Metabolic Panel: Recent Labs    05/24/22 0637 05/25/22 0511  NA 141 141  K 3.7 3.6  CL 109 110  CO2 22 23  GLUCOSE 229* 136*  BUN 21 22  CREATININE 0.82 0.99  CALCIUM 8.0* 7.8*   Liver Function Tests: Recent Labs    05/24/22 0637  AST 21  ALT 12  ALKPHOS 49  BILITOT 1.0  PROT 6.3*  ALBUMIN 3.5   Recent Labs    05/24/22 0637  LIPASE 28   CBC: Recent Labs    05/24/22 0637 05/25/22 0511 05/26/22 0531  WBC 5.9 3.7* 5.0  NEUTROABS 4.0  --   --   HGB 11.8* 9.9* 11.5*  HCT 37.7 31.6* 36.0  MCV 89.3 88.8 86.5  PLT 173 160 177   Cardiac Enzymes: No results for input(s): "CKTOTAL", "CKMB", "CKMBINDEX", "TROPONINI" in the last 72  hours. BNP: Invalid input(s): "POCBNP" D-Dimer: No results for input(s): "DDIMER" in the last 72 hours. Hemoglobin A1C: No results for input(s): "HGBA1C" in the last 72 hours. Fasting Lipid Panel: No results for input(s): "CHOL", "HDL", "LDLCALC", "TRIG", "CHOLHDL", "LDLDIRECT" in the last 72 hours. Thyroid Function Tests: No results for input(s): "TSH", "T4TOTAL", "T3FREE", "THYROIDAB" in the last 72 hours.  Invalid input(s): "FREET3" Anemia Panel: No results for input(s): "VITAMINB12", "FOLATE", "FERRITIN", "TIBC", "IRON", "RETICCTPCT" in the last 72 hours.  No results found.   Echo pending  TELEMETRY: Normal sinus rhythm left bundle branch block diffuse nonspecific ST-T wave changes:  ASSESSMENT AND PLAN:  Principal Problem:   NSTEMI (non-ST elevated myocardial infarction) ([place]) Active Problems:   Diabetes ([place])   Essential hypertension   Unstable angina ([facility])   Acute hypoxemic respiratory failure ([facility])   Known medical problems Elevated troponin possibly demand ischemia Dyspnea Chronic renal insufficiency stage III  Plan Acute congestive heart failure agree with diuretic therapy supplemental oxygen as necessary Echocardiogram for assessment of ventricular function with acute dyspnea shortness of breath elevated BNP Hypertension management and control will switch from amlodipine to losartan and metoprolol Continue Imdur for possible anginal equivalent symptoms Defer cardiac cath  for now in favor of medical management  Continue diabetes management and control    Yolonda Kida, MD 05/26/2022 1:17 PM

## 2022-05-26 NOTE — Progress Notes (Signed)
ANTICOAGULATION CONSULT NOTE -   Pharmacy Consult for Heparin Drip Indication: chest pain/ACS  Allergies  Allergen Reactions   Aspirin Other (See Comments)    Pt states she has metal clips and was told not to take asa   Butalbital-Apap-Caffeine Other (See Comments)   Ciprofloxacin Other (See Comments)   Diazepam Other (See Comments)   Ibuprofen Other (See Comments)   Lovastatin Other (See Comments)    Other reaction(s): Unknown   Other     Other reaction(s): Unknown   Propoxyphene Other (See Comments)   Statins     Other reaction(s): Muscle Pain Other reaction(s): Muscle Pain   Sulfa Antibiotics Other (See Comments)   Tapentadol Diarrhea    Patient Measurements: Height: '5\' 4"'$  (162.6 cm) Weight: 69.9 kg (154 lb) IBW/kg (Calculated) : 54.7 Heparin Dosing Weight: 68.8 kg  Vital Signs: Temp: 97.8 F (36.6 C) (01/07 2349) Temp Source: Oral (01/07 2349) BP: 168/75 (01/07 2349) Pulse Rate: 85 (01/07 2349)  Labs: Recent Labs    05/24/22 0637 05/24/22 0949 05/25/22 0511 05/25/22 0807 05/25/22 1151 05/25/22 1456 05/25/22 2100 05/26/22 0531  HGB 11.8*  --  9.9*  --   --   --   --  11.5*  HCT 37.7  --  31.6*  --   --   --   --  36.0  PLT 173  --  160  --   --   --   --  177  LABPROT 14.0  --   --   --   --   --   --   --   INR 1.1  --   --   --   --   --   --   --   HEPARINUNFRC  --    < >  --   --  0.30  --  0.36 0.45  CREATININE 0.82  --  0.99  --   --   --   --   --   TROPONINIHS 30*   < >  --  470*  --  388* 390*  --    < > = values in this interval not displayed.     Estimated Creatinine Clearance: 36.3 mL/min (by C-G formula based on SCr of 0.99 mg/dL).   Medical History: Past Medical History:  Diagnosis Date   Arthritis    Chronic diarrhea    Diabetes mellitus without complication ([place])    Gout    Hypercholesteremia    Hypertension    Spastic colon    Assessment: Patient is a 89yo female presenting with shortness of breath. Troponins trending  upward. Pharmacy consulted for Heparin dosing. No prior anticoagulants noted in history.  1/7 0318 HL=0.28, SUBtherapeutic  1/7 1151 HL=0.30 therapeutic, borderline, inc to 950 u/hr 1/7 2100 HL=0.36 therapeutic 1/8 0531 HL=0.45 therapeutic X 3   Goal of Therapy:  Heparin level 0.3-0.7 units/ml Monitor platelets by anticoagulation protocol: Yes   Plan:  Continue heparin drip at 950 units/hr.  Will recheck HL on 1/9 @ with AM labs CBC daily  Axavier Pressley D, PharmD 05/26/2022 6:20 AM

## 2022-05-27 ENCOUNTER — Inpatient Hospital Stay
Admit: 2022-05-27 | Discharge: 2022-05-27 | Disposition: A | Discharge status: Home / Self Care | Payer: Medicare Other | Attending: Internal Medicine | Admitting: Internal Medicine

## 2022-05-27 DIAGNOSIS — I214 Non-ST elevation (NSTEMI) myocardial infarction: Secondary | ICD-10-CM | POA: Diagnosis not present

## 2022-05-27 LAB — BLOOD CULTURE ID PANEL (REFLEXED) - BCID2

## 2022-05-27 LAB — CBC
HCT: 32.3 % — ABNORMAL LOW (ref 36.0–46.0)
Hemoglobin: 10.5 g/dL — ABNORMAL LOW (ref 12.0–15.0)
MCH: 28.2 pg (ref 26.0–34.0)
MCHC: 32.5 g/dL (ref 30.0–36.0)
MCV: 86.8 fL (ref 80.0–100.0)
Platelets: 162 10*3/uL (ref 150–400)
RBC: 3.72 MIL/uL — ABNORMAL LOW (ref 3.87–5.11)
RDW: 15.6 % — ABNORMAL HIGH (ref 11.5–15.5)
WBC: 3.6 10*3/uL — ABNORMAL LOW (ref 4.0–10.5)
nRBC: 0 % (ref 0.0–0.2)

## 2022-05-27 LAB — GLUCOSE, CAPILLARY
Glucose-Capillary: 145 mg/dL — ABNORMAL HIGH (ref 70–99)
Glucose-Capillary: 162 mg/dL — ABNORMAL HIGH (ref 70–99)

## 2022-05-27 LAB — HEPARIN LEVEL (UNFRACTIONATED): Heparin Unfractionated: 0.2 IU/mL — ABNORMAL LOW (ref 0.30–0.70)

## 2022-05-27 LAB — BRAIN NATRIURETIC PEPTIDE: B Natriuretic Peptide: 626.5 pg/mL — ABNORMAL HIGH (ref 0.0–100.0)

## 2022-05-27 MED ORDER — CLOPIDOGREL BISULFATE 75 MG PO TABS: 75.0000 mg | ORAL_TABLET | Freq: Every day | ORAL | 0 refills | Status: AC

## 2022-05-27 MED ORDER — METOPROLOL TARTRATE 25 MG PO TABS: 25.0000 mg | ORAL_TABLET | Freq: Two times a day (BID) | ORAL | 0 refills | Status: AC

## 2022-05-27 MED ORDER — LOSARTAN POTASSIUM 25 MG PO TABS: 25.0000 mg | ORAL_TABLET | Freq: Two times a day (BID) | ORAL | 0 refills | Status: AC

## 2022-05-27 MED ORDER — FUROSEMIDE 20 MG PO TABS: 20.0000 mg | ORAL_TABLET | Freq: Every day | ORAL | 0 refills | Status: AC

## 2022-05-27 MED ORDER — HEPARIN BOLUS VIA INFUSION
1050.0000 [IU] | Freq: Once | INTRAVENOUS | Status: AC
  Administered 2022-05-27: 1050 [IU] via INTRAVENOUS
  Filled 2022-05-27: qty 1050

## 2022-05-27 MED ORDER — CLOPIDOGREL BISULFATE 75 MG PO TABS
75.0000 mg | ORAL_TABLET | Freq: Every day | ORAL | Status: DC
  Administered 2022-05-27: 75 mg via ORAL
  Filled 2022-05-27: qty 1

## 2022-05-27 NOTE — Progress Notes (Signed)
*  PRELIMINARY RESULTS* Echocardiogram 2D Echocardiogram has been performed.  Elpidio Anis 05/27/2022, 10:38 AM

## 2022-05-27 NOTE — Progress Notes (Signed)
PHARMACY - PHYSICIAN COMMUNICATION CRITICAL VALUE ALERT - BLOOD CULTURE IDENTIFICATION (BCID)  Results for orders placed or performed during the hospital encounter of 05/24/22  Resp panel by RT-PCR (RSV, Flu A&B, Covid) Anterior Nasal Swab     Status: None   Collection Time: 05/24/22  6:36 AM   Specimen: Anterior Nasal Swab  Result Value Ref Range Status   SARS Coronavirus 2 by RT PCR NEGATIVE NEGATIVE Final    Comment: (NOTE) SARS-CoV-2 target nucleic acids are NOT DETECTED.  The SARS-CoV-2 RNA is generally detectable in upper respiratory specimens during the acute phase of infection. The lowest concentration of SARS-CoV-2 viral copies this assay can detect is 138 copies/mL. A negative result does not preclude SARS-Cov-2 infection and should not be used as the sole basis for treatment or other patient management decisions. A negative result may occur with  improper specimen collection/handling, submission of specimen other than nasopharyngeal swab, presence of viral mutation(s) within the areas targeted by this assay, and inadequate number of viral copies(<138 copies/mL). A negative result must be combined with clinical observations, patient history, and epidemiological information. The expected result is Negative.  Fact Sheet for Patients:  EntrepreneurPulse.com.au  Fact Sheet for Healthcare Providers:  IncredibleEmployment.be  This test is no t yet approved or cleared by the [place] FDA and  has been authorized for detection and/or diagnosis of SARS-CoV-2 by FDA under an Emergency Use Authorization (EUA). This EUA will remain  in effect (meaning this test can be used) for the duration of the COVID-19 declaration under Section 564(b)(1) of the Act, 21 U.S.C.section 360bbb-3(b)(1), unless the authorization is terminated  or revoked sooner.       Influenza A by PCR NEGATIVE NEGATIVE Final   Influenza B by PCR NEGATIVE NEGATIVE Final     Comment: (NOTE) The Xpert Xpress SARS-CoV-2/FLU/RSV plus assay is intended as an aid in the diagnosis of influenza from Nasopharyngeal swab specimens and should not be used as a sole basis for treatment. Nasal washings and aspirates are unacceptable for Xpert Xpress SARS-CoV-2/FLU/RSV testing.  Fact Sheet for Patients: EntrepreneurPulse.com.au  Fact Sheet for Healthcare Providers: IncredibleEmployment.be  This test is not yet approved or cleared by the [place] FDA and has been authorized for detection and/or diagnosis of SARS-CoV-2 by FDA under an Emergency Use Authorization (EUA). This EUA will remain in effect (meaning this test can be used) for the duration of the COVID-19 declaration under Section 564(b)(1) of the Act, 21 U.S.C. section 360bbb-3(b)(1), unless the authorization is terminated or revoked.     Resp Syncytial Virus by PCR NEGATIVE NEGATIVE Final    Comment: (NOTE) Fact Sheet for Patients: EntrepreneurPulse.com.au  Fact Sheet for Healthcare Providers: IncredibleEmployment.be  This test is not yet approved or cleared by the [place] FDA and has been authorized for detection and/or diagnosis of SARS-CoV-2 by FDA under an Emergency Use Authorization (EUA). This EUA will remain in effect (meaning this test can be used) for the duration of the COVID-19 declaration under Section 564(b)(1) of the Act, 21 U.S.C. section 360bbb-3(b)(1), unless the authorization is terminated or revoked.  Performed at [facility], [address] [place], [place] [address]   Blood culture (routine single)     Status: None (Preliminary result)   Collection Time: 05/24/22  6:37 AM   Specimen: BLOOD  Result Value Ref Range Status   Specimen Description BLOOD BLOOD RIGHT ARM  Final   Special Requests   Final    BOTTLES DRAWN AEROBIC AND ANAEROBIC  Blood Culture adequate volume   Culture   Setup Time AEROBIC BOTTLE ONLY Organism ID to follow   Final   Culture   Final    NO GROWTH 2 DAYS Performed at [facility], [address] [place], [place] [address]    Report Status PENDING  Incomplete    BCID Results: 1 (aerobic) of 2 bottles w/ G+ bacteria.  No BCID at this time.  Lab supervisor to review, sending to [facility] for further analysis. WBC WNL, afebrile.  Name of provider contacted: N/A. Will F?U additional lab results & notify provider when appropriate.   Changes to prescribed antibiotics required: N/A  Renda Rolls, PharmD, [facility] 05/27/2022 4:53 AM

## 2022-05-27 NOTE — Progress Notes (Signed)
[facility] Cardiology    SUBJECTIVE: Resting comfortably in bed denies any significant chest pain no shortness of breath is currently try to ambulate in the halls as well as lay flat to CBGs 80 orthopnea and PND no leg edema currently no chest pain   Vitals:   05/27/22 0053 05/27/22 0350 05/27/22 0846 05/27/22 1246  BP: (!) 139/54 (!) 166/53 (!) 176/62 (!) 122/47  Pulse: 68 61 74 67  Resp: '18 16  18  '$ Temp: 98.6 F (37 C) 98.2 F (36.8 C)  97.8 F (36.6 C)  TempSrc: Oral Oral  Oral  SpO2: 96% 94% 95% 96%  Weight:      Height:         Intake/Output Summary (Last 24 hours) at 05/27/2022 1253 Last data filed at 05/27/2022 1050 Gross per 24 hour  Intake 240 ml  Output 1150 ml  Net -910 ml      PHYSICAL EXAM  General: Well developed, well nourished, in no acute distress HEENT:  Normocephalic and atramatic Neck:  No JVD.  Lungs: Clear bilaterally to auscultation and percussion. Heart: HRRR . Normal S1 and S2 without gallops or murmurs.  Abdomen: Bowel sounds are positive, abdomen soft and non-tender  Msk:  Back normal, normal gait. Normal strength and tone for age. Extremities: No clubbing, cyanosis or edema.   Neuro: Alert and oriented X 3. Psych:  Good affect, responds appropriately   LABS: Basic Metabolic Panel: Recent Labs    05/25/22 0511  NA 141  K 3.6  CL 110  CO2 23  GLUCOSE 136*  BUN 22  CREATININE 0.99  CALCIUM 7.8*   Liver Function Tests: No results for input(s): "AST", "ALT", "ALKPHOS", "BILITOT", "PROT", "ALBUMIN" in the last 72 hours. No results for input(s): "LIPASE", "AMYLASE" in the last 72 hours. CBC: Recent Labs    05/26/22 0531 05/27/22 0628  WBC 5.0 3.6*  HGB 11.5* 10.5*  HCT 36.0 32.3*  MCV 86.5 86.8  PLT 177 162   Cardiac Enzymes: No results for input(s): "CKTOTAL", "CKMB", "CKMBINDEX", "TROPONINI" in the last 72 hours. BNP: Invalid input(s): "POCBNP" D-Dimer: No results for input(s): "DDIMER" in the last 72 hours. Hemoglobin  A1C: No results for input(s): "HGBA1C" in the last 72 hours. Fasting Lipid Panel: No results for input(s): "CHOL", "HDL", "LDLCALC", "TRIG", "CHOLHDL", "LDLDIRECT" in the last 72 hours. Thyroid Function Tests: No results for input(s): "TSH", "T4TOTAL", "T3FREE", "THYROIDAB" in the last 72 hours.  Invalid input(s): "FREET3" Anemia Panel: No results for input(s): "VITAMINB12", "FOLATE", "FERRITIN", "TIBC", "IRON", "RETICCTPCT" in the last 72 hours.  No results found.   Echo globally depressed ventricular function EF between 30 and 35% mild to moderate AI moderate to severe MR left ventricular enlargement  TELEMETRY: Normal sinus rhythm rate 75 nonspecific ST-T changes: Left bundle branch block  ASSESSMENT AND PLAN:  Principal Problem:   NSTEMI (non-ST elevated myocardial infarction) ([place]) Active Problems:   Diabetes ([place])   Essential hypertension   Unstable angina (HCC)   Acute hypoxemic respiratory failure (HCC)   Known medical problems    Plan Acute hypoxic respiratory failure resolved poss related to heart failure continue diuretic therapy Elevated troponin possible demand ischemia from heart failure versus non-STEMI will discontinue heparin and switch to Plavix for 3 to 6 months patient is reluctant to take aspirin Continue diabetes management and control Maintain hypertension management with losartan metoprolol therapy Recommend statin therapy Lipitor or Crestor Low-dose diuretic therapy daily for now then as needed as necessary Increase activity in the  hall with patient relatively asymptomatic would recommend conservative aggressive medical therapy Imdur has been added to help with potential anginal equivalent type symptoms We will defer cardiac cath for now if patient reasonably stable on medical therapy   Yolonda Kida, MD 05/27/2022 12:53 PM

## 2022-05-27 NOTE — Progress Notes (Signed)
  Transition of Care [facility]) Screening Note   Patient Details  Name: Dana Green Date of Birth: [birth year]   Transition of Care [facility]) CM/SW Contact:    Tiburcio Bash, LCSW Phone Number: 05/27/2022, 11:27 AM    Transition of Care Department [facility]) has reviewed patient and no TOC needs have been identified at this time. We will continue to monitor patient advancement through interdisciplinary progression rounds. If new patient transition needs arise, please place a TOC consult.  Kelby Fam, [place], MSW, [place]

## 2022-05-27 NOTE — Progress Notes (Signed)
PHARMACY - PHYSICIAN COMMUNICATION CRITICAL VALUE ALERT - BLOOD CULTURE IDENTIFICATION (BCID)  Dana Green is an 89 y.o. female who presented to [facility] on 05/24/2022 with a chief complaint of shortness of breath  Assessment: blood culture from 1/6 drawn as single set with GPR in aerobic bottle.  This is corrected from [facility] by [facility] lab staff read gram stain as GPC).  Nothing detected on BCID  Name of physician (or Provider) Contacted: Dr Hollice Gong  Current antibiotics: none  Changes to prescribed antibiotics recommended:  Recommendations accepted by provider - monitor off antibiotic as likely contaminant.  If needed to determine significant of culture, can repeat blood cx as patient has not received antibiotics  Results for orders placed or performed during the hospital encounter of 05/24/22  Blood Culture ID Panel (Reflexed) (Collected: 05/24/2022  6:37 AM)  Result Value Ref Range   Enterococcus faecalis NOT DETECTED NOT DETECTED   Enterococcus Faecium NOT DETECTED NOT DETECTED   Listeria monocytogenes NOT DETECTED NOT DETECTED   Staphylococcus species NOT DETECTED NOT DETECTED   Staphylococcus aureus (BCID) NOT DETECTED NOT DETECTED   Staphylococcus epidermidis NOT DETECTED NOT DETECTED   Staphylococcus lugdunensis NOT DETECTED NOT DETECTED   Streptococcus species NOT DETECTED NOT DETECTED   Streptococcus agalactiae NOT DETECTED NOT DETECTED   Streptococcus pneumoniae NOT DETECTED NOT DETECTED   Streptococcus pyogenes NOT DETECTED NOT DETECTED   A.calcoaceticus-baumannii NOT DETECTED NOT DETECTED   Bacteroides fragilis NOT DETECTED NOT DETECTED   Enterobacterales NOT DETECTED NOT DETECTED   Enterobacter cloacae complex NOT DETECTED NOT DETECTED   Escherichia coli NOT DETECTED NOT DETECTED   Klebsiella aerogenes NOT DETECTED NOT DETECTED   Klebsiella oxytoca NOT DETECTED NOT DETECTED   Klebsiella pneumoniae NOT DETECTED NOT DETECTED   Proteus  species NOT DETECTED NOT DETECTED   Salmonella species NOT DETECTED NOT DETECTED   Serratia marcescens NOT DETECTED NOT DETECTED   Haemophilus influenzae NOT DETECTED NOT DETECTED   Neisseria meningitidis NOT DETECTED NOT DETECTED   Pseudomonas aeruginosa NOT DETECTED NOT DETECTED   Stenotrophomonas maltophilia NOT DETECTED NOT DETECTED   Candida albicans NOT DETECTED NOT DETECTED   Candida auris NOT DETECTED NOT DETECTED   Candida glabrata NOT DETECTED NOT DETECTED   Candida krusei NOT DETECTED NOT DETECTED   Candida parapsilosis NOT DETECTED NOT DETECTED   Candida tropicalis NOT DETECTED NOT DETECTED   Cryptococcus neoformans/gattii NOT DETECTED NOT DETECTED    Doreene Eland, PharmD, BCPS, BCIDP Work Cell: [phone] 05/27/2022 12:17 PM

## 2022-05-27 NOTE — Consult Note (Signed)
[facility] for Heparin Indication: chest pain/ACS  Allergies  Allergen Reactions   Aspirin Other (See Comments)    Pt states she has metal clips and was told not to take asa   Butalbital-Apap-Caffeine Other (See Comments)   Ciprofloxacin Other (See Comments)   Diazepam Other (See Comments)   Ibuprofen Other (See Comments)   Lovastatin Other (See Comments)    Other reaction(s): Unknown   Other     Other reaction(s): Unknown   Propoxyphene Other (See Comments)   Statins     Other reaction(s): Muscle Pain Other reaction(s): Muscle Pain   Sulfa Antibiotics Other (See Comments)   Tapentadol Diarrhea    Patient Measurements: Height: '5\' 4"'$  (162.6 cm) Weight: 69.9 kg (154 lb) IBW/kg (Calculated) : 54.7 Heparin Dosing Weight: 69.9 kg   Vital Signs: Temp: 98.2 F (36.8 C) (01/09 0350) Temp Source: Oral (01/09 0350) BP: 166/53 (01/09 0350) Pulse Rate: 61 (01/09 0350)  Labs: Recent Labs    05/25/22 0511 05/25/22 0807 05/25/22 1151 05/25/22 1456 05/25/22 2100 05/26/22 0531 05/27/22 0628  HGB 9.9*  --   --   --   --  11.5* 10.5*  HCT 31.6*  --   --   --   --  36.0 32.3*  PLT 160  --   --   --   --  177 162  HEPARINUNFRC  --   --    < >  --  0.36 0.45 0.20*  CREATININE 0.99  --   --   --   --   --   --   TROPONINIHS  --  470*  --  388* 390*  --   --    < > = values in this interval not displayed.    Estimated Creatinine Clearance: 36.3 mL/min (by C-G formula based on SCr of 0.99 mg/dL).   Medications:  Medications Prior to Admission  Medication Sig Dispense Refill Last Dose   allopurinol (ZYLOPRIM) 100 MG tablet Take 100 mg by mouth 2 (two) times daily.    05/23/2022   amLODipine (NORVASC) 2.5 MG tablet Take 2.5 mg by mouth daily.   05/23/2022   colestipol (COLESTID) 1 g tablet Take 1 g by mouth daily as needed.    05/23/2022   glipiZIDE (GLUCOTROL) 10 MG tablet Take 10 mg by mouth daily before breakfast.   05/23/2022   isosorbide  mononitrate (IMDUR) 30 MG 24 hr tablet Take 1 tablet (30 mg total) by mouth daily. 30 tablet 0 05/23/2022   leflunomide (ARAVA) 20 MG tablet Take 20 mg by mouth daily.   05/23/2022   metFORMIN (GLUCOPHAGE) 1000 MG tablet Take 1,000 mg by mouth daily with breakfast.   05/23/2022   metoprolol succinate (TOPROL-XL) 25 MG 24 hr tablet Take 25 mg by mouth daily.   05/23/2022   omeprazole (PRILOSEC) 20 MG capsule Take 20 mg by mouth daily.    05/23/2022   loratadine (CLARITIN) 10 MG tablet Take 10 mg by mouth daily.    Unknown   Multiple Vitamins-Minerals (OCUVITE ADULT FORMULA) CAPS Take 1 capsule by mouth daily.    Unknown   Scheduled:   allopurinol  100 mg Oral BID   furosemide  20 mg Oral Daily   insulin aspart  0-9 Units Subcutaneous TID WC   isosorbide mononitrate  30 mg Oral Daily   leflunomide  20 mg Oral Daily   losartan  25 mg Oral BID   metoprolol tartrate  25 mg  Oral BID   pantoprazole  40 mg Oral Daily   Infusions:   heparin 950 Units/hr (05/26/22 1922)   PRN: ondansetron **OR** ondansetron (ZOFRAN) IV, polyethylene glycol, traZODone Anti-infectives (From admission, onward)    None       Assessment: 89 year old female with a PMH: RA, GAD, DM, CAD/unstable angina, and HTN. Pharmacy consulted to start heparin infusion for possible ACS, SOB, and chest pressure. Trop elevated to a peak of 1232, now trending down. Hgb is stable at 10.5 and plts stable at 162. No DOAC prior to admission. BNP elevated at 626.5.   1/8 0531 HL 0.45  1/9 0628 HL 0.2.   Goal of Therapy:  Heparin level 0.3-0.7 units/ml Monitor platelets by anticoagulation protocol: Yes   Plan:  Heparin level is subtherapeutic. Will give a heparin bolus of 1050 units x 1 and increase heparin infusion to 1050 units/hr. Recheck heparin level in 8 hours. CBC daily while on heparin. Per RN, no interruptions to the heparin that he is aware of. No cath plan. Plan for medical management. F/u with duration of heparin infusion.    Oswald Hillock PharmD, BCPS 05/27/2022,7:47 AM

## 2022-05-27 NOTE — Plan of Care (Signed)
  Problem: Education: Goal: Knowledge of General Education information will improve Description: Including pain rating scale, medication(s)/side effects and non-pharmacologic comfort measures Outcome: Progressing   Problem: Health Behavior/Discharge Planning: Goal: Ability to manage health-related needs will improve Outcome: Progressing   Problem: Clinical Measurements: Goal: Ability to maintain clinical measurements within normal limits will improve Outcome: Progressing Goal: Will remain free from infection Outcome: Progressing Goal: Diagnostic test results will improve Outcome: Progressing Goal: Respiratory complications will improve Outcome: Progressing Goal: Cardiovascular complication will be avoided Outcome: Progressing   Problem: Activity: Goal: Risk for activity intolerance will decrease Outcome: Progressing   Problem: Nutrition: Goal: Adequate nutrition will be maintained Outcome: Progressing   Problem: Coping: Goal: Level of anxiety will decrease Outcome: Progressing   Problem: Elimination: Goal: Will not experience complications related to bowel motility Outcome: Progressing Goal: Will not experience complications related to urinary retention Outcome: Progressing   Problem: Pain Managment: Goal: General experience of comfort will improve Outcome: Progressing   Problem: Safety: Goal: Ability to remain free from injury will improve Outcome: Progressing   Problem: Skin Integrity: Goal: Risk for impaired skin integrity will decrease Outcome: Progressing   Problem: Education: Goal: Understanding of cardiac disease, CV risk reduction, and recovery process will improve Outcome: Progressing Goal: Understanding of medication regimen will improve Outcome: Progressing Goal: Individualized Educational Video(s) Outcome: Progressing   Problem: Activity: Goal: Ability to tolerate increased activity will improve Outcome: Progressing   Problem: Cardiac: Goal:  Ability to achieve and maintain adequate cardiopulmonary perfusion will improve Outcome: Progressing Goal: Vascular access site(s) Level 0-1 will be maintained Outcome: Progressing   Problem: Health Behavior/Discharge Planning: Goal: Ability to safely manage health-related needs after discharge will improve Outcome: Progressing   Problem: Education: Goal: Ability to describe self-care measures that may prevent or decrease complications (Diabetes Survival Skills Education) will improve Outcome: Progressing Goal: Individualized Educational Video(s) Outcome: Progressing   Problem: Coping: Goal: Ability to adjust to condition or change in health will improve Outcome: Progressing   Problem: Fluid Volume: Goal: Ability to maintain a balanced intake and output will improve Outcome: Progressing   Problem: Health Behavior/Discharge Planning: Goal: Ability to identify and utilize available resources and services will improve Outcome: Progressing Goal: Ability to manage health-related needs will improve Outcome: Progressing   Problem: Metabolic: Goal: Ability to maintain appropriate glucose levels will improve Outcome: Progressing   Problem: Nutritional: Goal: Maintenance of adequate nutrition will improve Outcome: Progressing Goal: Progress toward achieving an optimal weight will improve Outcome: Progressing   Problem: Skin Integrity: Goal: Risk for impaired skin integrity will decrease Outcome: Progressing   Problem: Tissue Perfusion: Goal: Adequacy of tissue perfusion will improve Outcome: Progressing

## 2022-05-27 NOTE — Progress Notes (Signed)
Patient was ambulated around nursing station by primary RN as ordered; patient tolerated well and no shortness of breath noted while ambulating; oxygen saturation was stayed between 94% and 96% on room air.

## 2022-05-27 NOTE — Discharge Summary (Addendum)
Discharge Summary  Dana Green UPJ:[id number] DOB: 1930/02/03  PCP: Tracie Harrier, MD  Admit date: 05/24/2022 Discharge date: 05/27/2022   Recommendations for Outpatient Follow-up:  Please follow up with your PCP with CBC and BMP in 1-2 weeks Follow up with Cardiology Dr. Clayborn Bigness in 2-3 weeks.  Discharge Diagnoses:  Active Hospital Problems   Diagnosis Date Noted   NSTEMI (non-ST elevated myocardial infarction) ([place]) Acute on chronic systolic CHF 92/92/4462   Acute hypoxemic respiratory failure ([place]) 05/24/2022   Known medical problems 05/24/2022   Unstable angina ([place]) 12/27/2017   Diabetes ([place]) 11/11/2016   Essential hypertension 11/11/2016    Resolved Hospital Problems  No resolved problems to display.   Discharge Condition: Stable   Diet recommendation: Diet Orders (From admission, onward)     Start     Ordered   05/26/22 1412  Diet 2 gram sodium Room service appropriate? Yes; Fluid consistency: Thin  Diet effective now       Question Answer Comment  Room service appropriate? Yes   Fluid consistency: Thin      05/26/22 1412           HPI and Brief Hospital Course:  Dana Green is a 89 y.o. female with medical history significant for rheumatoid arthritis, diffuse OA, GAD, type 2 diabetes with polyneuropathy, unstable angina, hypertension, who presents with shortness of breath and was found to have NSTEMI.  Her troponin peaked at 1200, she was maintained on heparin drip until just prior to discharge home.  She presented with complaints of hypertension and hypoxic respiratory failure that has improved; she was started on ASA and Heparin gtt after admission and is now resting comfortably and free of symptoms.  Initially there was plan for diagnostic catheterization, but after discussion with Dr. Clayborn Bigness yesterday afternoon, decision was made for medical management for the time being.  Patient was seen ambulating in her room with her daughter at the  bedside this morning, completely asymptomatic and comfortable hoping to go home.  This afternoon after some discussion, cardiology has discontinued heparin drip, started the patient on Plavix.  They also recommend aspirin but the patient is hesitant to take this medication due to prior history of AVM repair in 1981.  Patient has also been started on short course of oral diuretic, and blood pressure medications have been adjusted as listed in the AVS below.  As patient is stable, chest pain-free on room air ambulating without difficulty, she is ready for discharge home from the hospital today.  Procedures: None  Consultations: Cardiology  Discharge details, plan of care and follow up instructions were discussed with patient and her daughter. Patient and family are in agreement with discharge from the hospital today and all questions were answered to their satisfaction.  Discharge Exam: BP (!) 122/47 (BP Location: Left Arm)   Pulse 67   Temp 97.8 F (36.6 C) (Oral)   Resp 18   Ht '5\' 4"'$  (1.626 m)   Wt 69.9 kg   SpO2 96%   BMI 26.43 kg/m  General:  Alert, oriented, calm, in no acute distress  Eyes: EOMI, clear sclerea Neck: supple, no masses, trachea mildline  Cardiovascular: RRR, no murmurs or rubs, no peripheral edema  Respiratory: clear to auscultation bilaterally, no wheezes, no crackles  Abdomen: soft, nontender, nondistended, normal bowel tones heard  Skin: dry, no rashes  Musculoskeletal: no joint effusions, normal range of motion  Psychiatric: appropriate affect, normal speech  Neurologic: extraocular muscles intact, clear speech, moving all  extremities with intact sensorium   Discharge Instructions You were cared for by a hospitalist during your hospital stay. If you have any questions about your discharge medications or the care you received while you were in the hospital after you are discharged, you can call the unit and asked to speak with the hospitalist on call if the  hospitalist that took care of you is not available. Once you are discharged, your primary care physician will handle any further medical issues. Please note that NO REFILLS for any discharge medications will be authorized once you are discharged, as it is imperative that you return to your primary care physician (or establish a relationship with a primary care physician if you do not have one) for your aftercare needs so that they can reassess your need for medications and monitor your lab values.   Allergies as of 05/27/2022       Reactions   Aspirin Other (See Comments)   Pt states she has metal clips and was told not to take asa   Butalbital-apap-caffeine Other (See Comments)   Ciprofloxacin Other (See Comments)   Diazepam Other (See Comments)   Ibuprofen Other (See Comments)   Lovastatin Other (See Comments)   Other reaction(s): Unknown   Other    Other reaction(s): Unknown   Propoxyphene Other (See Comments)   Statins    Other reaction(s): Muscle Pain Other reaction(s): Muscle Pain   Sulfa Antibiotics Other (See Comments)   Tapentadol Diarrhea        Medication List     STOP taking these medications    amLODipine 2.5 MG tablet Commonly known as: NORVASC   metoprolol succinate 25 MG 24 hr tablet Commonly known as: TOPROL-XL       TAKE these medications    allopurinol 100 MG tablet Commonly known as: ZYLOPRIM Take 100 mg by mouth 2 (two) times daily.   clopidogrel 75 MG tablet Commonly known as: PLAVIX Take 1 tablet (75 mg total) by mouth daily. Start taking on: May 28, 2022   colestipol 1 g tablet Commonly known as: COLESTID Take 1 g by mouth daily as needed.   furosemide 20 MG tablet Commonly known as: LASIX Take 1 tablet (20 mg total) by mouth daily for 7 days. Start taking on: May 28, 2022   glipiZIDE 10 MG tablet Commonly known as: GLUCOTROL Take 10 mg by mouth daily before breakfast.   isosorbide mononitrate 30 MG 24 hr tablet Commonly  known as: IMDUR Take 1 tablet (30 mg total) by mouth daily.   leflunomide 20 MG tablet Commonly known as: ARAVA Take 20 mg by mouth daily.   loratadine 10 MG tablet Commonly known as: CLARITIN Take 10 mg by mouth daily.   losartan 25 MG tablet Commonly known as: COZAAR Take 1 tablet (25 mg total) by mouth 2 (two) times daily.   metFORMIN 1000 MG tablet Commonly known as: GLUCOPHAGE Take 1,000 mg by mouth daily with breakfast.   metoprolol tartrate 25 MG tablet Commonly known as: LOPRESSOR Take 1 tablet (25 mg total) by mouth 2 (two) times daily.   Ocuvite Adult Formula Caps Take 1 capsule by mouth daily.   omeprazole 20 MG capsule Commonly known as: PRILOSEC Take 20 mg by mouth daily.       Allergies  Allergen Reactions   Aspirin Other (See Comments)    Pt states she has metal clips and was told not to take asa   Butalbital-Apap-Caffeine Other (See Comments)   Ciprofloxacin Other (  See Comments)   Diazepam Other (See Comments)   Ibuprofen Other (See Comments)   Lovastatin Other (See Comments)    Other reaction(s): Unknown   Other     Other reaction(s): Unknown   Propoxyphene Other (See Comments)   Statins     Other reaction(s): Muscle Pain Other reaction(s): Muscle Pain   Sulfa Antibiotics Other (See Comments)   Tapentadol Diarrhea    Follow-up Information     Hande, Vishwanath, MD Follow up in 2 week(s).   Specialty: Internal Medicine Contact information: [address] [place] [address] [phone]         Yolonda Kida, MD Follow up in 2 week(s).   Specialties: Cardiology, Internal Medicine Contact information: [place] [address] [phone]                 The results of significant diagnostics from this hospitalization (including imaging, microbiology, ancillary and laboratory) are listed below for reference.    Significant Diagnostic Studies: DG Chest Port 1  View  Result Date: 05/24/2022 CLINICAL DATA:  Respiratory distress. EXAM: PORTABLE CHEST 1 VIEW COMPARISON:  12/27/2017 FINDINGS: 0644 hours. Lungs are hyperexpanded. The cardio pericardial silhouette is enlarged. Vascular congestion with diffuse interstitial opacity is compatible with edema. Small left pleural effusion evident. IMPRESSION: Pulmonary edema with small left pleural effusion. Electronically Signed   By: Misty Stanley M.D.   On: 05/24/2022 07:01    Microbiology: Recent Results (from the past 240 hour(s))  Resp panel by RT-PCR (RSV, Flu A&B, Covid) Anterior Nasal Swab     Status: None   Collection Time: 05/24/22  6:36 AM   Specimen: Anterior Nasal Swab  Result Value Ref Range Status   SARS Coronavirus 2 by RT PCR NEGATIVE NEGATIVE Final    Comment: (NOTE) SARS-CoV-2 target nucleic acids are NOT DETECTED.  The SARS-CoV-2 RNA is generally detectable in upper respiratory specimens during the acute phase of infection. The lowest concentration of SARS-CoV-2 viral copies this assay can detect is 138 copies/mL. A negative result does not preclude SARS-Cov-2 infection and should not be used as the sole basis for treatment or other patient management decisions. A negative result may occur with  improper specimen collection/handling, submission of specimen other than nasopharyngeal swab, presence of viral mutation(s) within the areas targeted by this assay, and inadequate number of viral copies(<138 copies/mL). A negative result must be combined with clinical observations, patient history, and epidemiological information. The expected result is Negative.  Fact Sheet for Patients:  EntrepreneurPulse.com.au  Fact Sheet for Healthcare Providers:  IncredibleEmployment.be  This test is no t yet approved or cleared by the [place] FDA and  has been authorized for detection and/or diagnosis of SARS-CoV-2 by FDA under an Emergency Use Authorization  (EUA). This EUA will remain  in effect (meaning this test can be used) for the duration of the COVID-19 declaration under Section 564(b)(1) of the Act, 21 U.S.C.section 360bbb-3(b)(1), unless the authorization is terminated  or revoked sooner.       Influenza A by PCR NEGATIVE NEGATIVE Final   Influenza B by PCR NEGATIVE NEGATIVE Final    Comment: (NOTE) The Xpert Xpress SARS-CoV-2/FLU/RSV plus assay is intended as an aid in the diagnosis of influenza from Nasopharyngeal swab specimens and should not be used as a sole basis for treatment. Nasal washings and aspirates are unacceptable for Xpert Xpress SARS-CoV-2/FLU/RSV testing.  Fact Sheet for Patients: EntrepreneurPulse.com.au  Fact Sheet for Healthcare Providers: IncredibleEmployment.be  This test is not yet approved or cleared by the [place] and has been authorized for detection and/or diagnosis of SARS-CoV-2 by FDA under an Emergency Use Authorization (EUA). This EUA will remain in effect (meaning this test can be used) for the duration of the COVID-19 declaration under Section 564(b)(1) of the Act, 21 U.S.C. section 360bbb-3(b)(1), unless the authorization is terminated or revoked.     Resp Syncytial Virus by PCR NEGATIVE NEGATIVE Final    Comment: (NOTE) Fact Sheet for Patients: EntrepreneurPulse.com.au  Fact Sheet for Healthcare Providers: IncredibleEmployment.be  This test is not yet approved or cleared by the [place] FDA and has been authorized for detection and/or diagnosis of SARS-CoV-2 by FDA under an Emergency Use Authorization (EUA). This EUA will remain in effect (meaning this test can be used) for the duration of the COVID-19 declaration under Section 564(b)(1) of the Act, 21 U.S.C. section 360bbb-3(b)(1), unless the authorization is terminated or revoked.  Performed at [facility], [address]  [address] [place], [place] [address]   Blood culture (routine single)     Status: None (Preliminary result)   Collection Time: 05/24/22  6:37 AM   Specimen: BLOOD  Result Value Ref Range Status   Specimen Description   Final    BLOOD BLOOD RIGHT ARM Performed at [facility], [address], [place], [place] [address]    Special Requests   Final    BOTTLES DRAWN AEROBIC AND ANAEROBIC Blood Culture adequate volume Performed at [facility], [address], [place], [place] [address]    Culture  Setup Time   Final    GRAM POSITIVE RODS Organism ID to follow AEROBIC BOTTLE ONLY CRITICAL RESULT CALLED TO, READ BACK BY AND VERIFIED WITH: NATHAN BELUE 05/27/2021 AT 0450 SRR CRITICAL RESULT CALLED TO, READ BACK BY AND VERIFIED WITH: Esperanza Richters [id number] FCP Performed at [facility], [address], [place], [place] [address]    Culture GRAM POSITIVE RODS  Final   Report Status PENDING  Incomplete  Blood Culture ID Panel (Reflexed)     Status: None   Collection Time: 05/24/22  6:37 AM  Result Value Ref Range Status   Enterococcus faecalis NOT DETECTED NOT DETECTED Final   Enterococcus Faecium NOT DETECTED NOT DETECTED Final   Listeria monocytogenes NOT DETECTED NOT DETECTED Final   Staphylococcus species NOT DETECTED NOT DETECTED Final   Staphylococcus aureus (BCID) NOT DETECTED NOT DETECTED Final   Staphylococcus epidermidis NOT DETECTED NOT DETECTED Final   Staphylococcus lugdunensis NOT DETECTED NOT DETECTED Final   Streptococcus species NOT DETECTED NOT DETECTED Final   Streptococcus agalactiae NOT DETECTED NOT DETECTED Final   Streptococcus pneumoniae NOT DETECTED NOT DETECTED Final   Streptococcus pyogenes NOT DETECTED NOT DETECTED Final   A.calcoaceticus-baumannii NOT DETECTED NOT DETECTED Final   Bacteroides fragilis NOT DETECTED NOT DETECTED Final   Enterobacterales NOT DETECTED NOT DETECTED Final   Enterobacter cloacae complex NOT DETECTED NOT DETECTED  Final   Escherichia coli NOT DETECTED NOT DETECTED Final   Klebsiella aerogenes NOT DETECTED NOT DETECTED Final   Klebsiella oxytoca NOT DETECTED NOT DETECTED Final   Klebsiella pneumoniae NOT DETECTED NOT DETECTED Final   Proteus species NOT DETECTED NOT DETECTED Final   Salmonella species NOT DETECTED NOT DETECTED Final   Serratia marcescens NOT DETECTED NOT DETECTED Final   Haemophilus influenzae NOT DETECTED NOT DETECTED Final   Neisseria meningitidis NOT DETECTED NOT DETECTED Final   Pseudomonas aeruginosa NOT  DETECTED NOT DETECTED Final   Stenotrophomonas maltophilia NOT DETECTED NOT DETECTED Final   Candida albicans NOT DETECTED NOT DETECTED Final   Candida auris NOT DETECTED NOT DETECTED Final   Candida glabrata NOT DETECTED NOT DETECTED Final   Candida krusei NOT DETECTED NOT DETECTED Final   Candida parapsilosis NOT DETECTED NOT DETECTED Final   Candida tropicalis NOT DETECTED NOT DETECTED Final   Cryptococcus neoformans/gattii NOT DETECTED NOT DETECTED Final    Comment: Performed at [facility], [address] [place], [place] [address]    Labs: Basic Metabolic Panel: Recent Labs  Lab 05/24/22 0637 05/25/22 0511  NA 141 141  K 3.7 3.6  CL 109 110  CO2 22 23  GLUCOSE 229* 136*  BUN 21 22  CREATININE 0.82 0.99  CALCIUM 8.0* 7.8*   Liver Function Tests: Recent Labs  Lab 05/24/22 0637  AST 21  ALT 12  ALKPHOS 49  BILITOT 1.0  PROT 6.3*  ALBUMIN 3.5   Recent Labs  Lab 05/24/22 0637  LIPASE 28   No results for input(s): "AMMONIA" in the last 168 hours. CBC: Recent Labs  Lab 05/24/22 0637 05/25/22 0511 05/26/22 0531 05/27/22 0628  WBC 5.9 3.7* 5.0 3.6*  NEUTROABS 4.0  --   --   --   HGB 11.8* 9.9* 11.5* 10.5*  HCT 37.7 31.6* 36.0 32.3*  MCV 89.3 88.8 86.5 86.8  PLT 173 160 177 162   Cardiac Enzymes: No results for input(s): "CKTOTAL", "CKMB", "CKMBINDEX", "TROPONINI" in the last 168 hours. BNP: BNP (last 3 results) Recent Labs     05/24/22 0637 05/27/22 0628  BNP 530.5* 626.5*   ProBNP (last 3 results) No results for input(s): "PROBNP" in the last 8760 hours.  CBG: Recent Labs  Lab 05/26/22 1552 05/26/22 2056 05/27/22 0831 05/27/22 1247  GLUCAP 126* 155* 145* 162*   Time spent: > 30 minutes were spent in preparing this discharge including medication reconciliation, counseling, and coordination of care.  Signed: Afomia Blackley Marry Guan, MD  [facility] 05/27/2022, 2:06 PM

## 2022-05-27 NOTE — Care Management Important Message (Signed)
Important Message  Patient Details  Name: Dana Green MRN: [id number] Date of Birth: [birth year]   Medicare Important Message Given:  Yes     Dannette Barbara 05/27/2022, 3:34 PM

## 2022-05-27 NOTE — Progress Notes (Signed)
PROGRESS NOTE  Dana Green NGE:[id number] DOB: [birth year] DOA: 05/24/2022 PCP: Tracie Harrier, MD  Hospital Course/Subjective: Dana Green is a 89 y.o. female with medical history significant for rheumatoid arthritis, diffuse OA, GAD, type 2 diabetes with polyneuropathy, unstable angina, hypertension, who presents with shortness of breath and was found to have NSTEMI. She presented with complaints of hypertension and hypoxic respiratory failure that has improved; she was started on ASA and Heparin gtt after admission and is now resting comfortably and free of symptoms.   After discussion with Dr. Clayborn Bigness yesterday afternoon, decision was made for medical management for the time being.  Patient remains asymptomatic on heparin drip.  Assessment/Plan:  Principal Problem:   NSTEMI (non-ST elevated myocardial infarction) ([place]) Active Problems:   Diabetes ([place])   Essential hypertension   Unstable angina ([place])   Acute hypoxemic respiratory failure ([place])   Known medical problems   Assessment and Plan: * NSTEMI and Acute hypoxemic respiratory failure ([place]) Patient presenting with acute shortness of breath and chest tightness as well as reportedly extremely elevated blood pressures which have improved.  Possible etiology of presentation include flash pulmonary edema secondary to elevated blood pressures which is now seem to mostly resolved, unstable angina given recurrence of symptoms over 2 nights.  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 - Cardiology consulted, recommend heparin and awaiting further recs  Hypertension-continue Imdur, metoprolol BP not at goal anticipate further adjustment by cardiology  Gout-continue allopurinol  RA-continue leflunomide  GERD-continue PPI  DM2-hold glipizide and metformin, Sliding scale correction  DVT Prophylaxis:  Hep gtt  Code Status: FULL   Family Communication: Discussed with patient and her daughter on PCU once again this AM.   Disposition Plan: Home when medically stable.   Consultants: Cardiology   Procedures: None   Antimicrobials: Anti-infectives (From admission, onward)    None       Objective: Vitals:   05/26/22 2053 05/27/22 0053 05/27/22 0350 05/27/22 0846  BP: (!) 130/58 (!) 139/54 (!) 166/53 (!) 176/62  Pulse: 75 68 61 74  Resp: '16 18 16   '$ Temp: 97.7 F (36.5 C) 98.6 F (37 C) 98.2 F (36.8 C)   TempSrc: Oral Oral Oral   SpO2: 93% 96% 94% 95%  Weight:      Height:        Intake/Output Summary (Last 24 hours) at 05/27/2022 1215 Last data filed at 05/27/2022 1050 Gross per 24 hour  Intake 240 ml  Output 1150 ml  Net -910 ml    Filed Weights   05/24/22 0635  Weight: 69.9 kg   Exam: General:  Alert, oriented, calm, in no acute distress, daughter at bedside Eyes: EOMI, clear conjuctivae, white sclerea Neck: supple, no masses, trachea mildline  Cardiovascular: RRR, no murmurs or rubs, no peripheral edema  Respiratory: clear to auscultation bilaterally, no wheezes, no crackles  Abdomen: soft, nontender, nondistended, normal bowel tones heard  Skin: dry, no rashes  Musculoskeletal: no joint effusions, normal range of motion  Psychiatric: appropriate affect, normal speech  Neurologic: extraocular muscles intact, clear speech, moving all extremities with intact sensorium   Data Reviewed: CBC: Recent Labs  Lab 05/24/22 0637 05/25/22 0511 05/26/22 0531 05/27/22 0628  WBC 5.9 3.7* 5.0 3.6*  NEUTROABS 4.0  --   --   --   HGB 11.8* 9.9* 11.5* 10.5*  HCT 37.7 31.6* 36.0 32.3*  MCV 89.3 88.8 86.5 86.8  PLT 173 160 177 427    Basic Metabolic Panel: Recent Labs  Lab 05/24/22 0637 05/25/22 0511  NA 141 141  K 3.7 3.6  CL 109 110  CO2 22 23  GLUCOSE 229* 136*  BUN 21 22  CREATININE 0.82 0.99  CALCIUM 8.0* 7.8*    GFR: Estimated Creatinine  Clearance: 36.3 mL/min (by C-G formula based on SCr of 0.99 mg/dL). Liver Function Tests: Recent Labs  Lab 05/24/22 0637  AST 21  ALT 12  ALKPHOS 49  BILITOT 1.0  PROT 6.3*  ALBUMIN 3.5    Recent Labs  Lab 05/24/22 0637  LIPASE 28    No results for input(s): "AMMONIA" in the last 168 hours. Coagulation Profile: Recent Labs  Lab 05/24/22 0637  INR 1.1    Cardiac Enzymes: No results for input(s): "CKTOTAL", "CKMB", "CKMBINDEX", "TROPONINI" in the last 168 hours. BNP (last 3 results) No results for input(s): "PROBNP" in the last 8760 hours. HbA1C: No results for input(s): "HGBA1C" in the last 72 hours. CBG: Recent Labs  Lab 05/26/22 1552 05/26/22 2056 05/27/22 0831  GLUCAP 126* 155* 145*   Lipid Profile: No results for input(s): "CHOL", "HDL", "LDLCALC", "TRIG", "CHOLHDL", "LDLDIRECT" in the last 72 hours. Thyroid Function Tests: No results for input(s): "TSH", "T4TOTAL", "FREET4", "T3FREE", "THYROIDAB" in the last 72 hours. Anemia Panel: No results for input(s): "VITAMINB12", "FOLATE", "FERRITIN", "TIBC", "IRON", "RETICCTPCT" in the last 72 hours. Urine analysis:    Component Value Date/Time   COLORURINE Yellow 07/24/2011 1053   APPEARANCEUR Hazy 07/24/2011 1053   LABSPEC 1.021 07/24/2011 1053   PHURINE 5.0 07/24/2011 1053   GLUCOSEU 150 mg/dL 07/24/2011 1053   HGBUR Negative 07/24/2011 1053   BILIRUBINUR Negative 07/24/2011 1053   KETONESUR Negative 07/24/2011 1053   PROTEINUR Negative 07/24/2011 1053   NITRITE Negative 07/24/2011 1053   LEUKOCYTESUR Trace 07/24/2011 1053   Sepsis Labs: '@LABRCNTIP'$ (procalcitonin:4,lacticidven:4)  ) Recent Results (from the past 240 hour(s))  Resp panel by RT-PCR (RSV, Flu A&B, Covid) Anterior Nasal Swab     Status: None   Collection Time: 05/24/22  6:36 AM   Specimen: Anterior Nasal Swab  Result Value Ref Range Status   SARS Coronavirus 2 by RT PCR NEGATIVE NEGATIVE Final    Comment: (NOTE) SARS-CoV-2 target  nucleic acids are NOT DETECTED.  The SARS-CoV-2 RNA is generally detectable in upper respiratory specimens during the acute phase of infection. The lowest concentration of SARS-CoV-2 viral copies this assay can detect is 138 copies/mL. A negative result does not preclude SARS-Cov-2 infection and should not be used as the sole basis for treatment or other patient management decisions. A negative result may occur with  improper specimen collection/handling, submission of specimen other than nasopharyngeal swab, presence of viral mutation(s) within the areas targeted by this assay, and inadequate number of viral copies(<138 copies/mL). A negative result must be combined with clinical observations, patient history, and epidemiological information. The expected result is Negative.  Fact Sheet for Patients:  EntrepreneurPulse.com.au  Fact Sheet for Healthcare Providers:  IncredibleEmployment.be  This test is no t yet approved or cleared by the [place] FDA and  has been authorized for detection and/or diagnosis of SARS-CoV-2 by FDA under an Emergency Use Authorization (EUA). This EUA will remain  in effect (meaning this test can be used) for the duration of the COVID-19 declaration under Section 564(b)(1) of the Act, 21 U.S.C.section 360bbb-3(b)(1), unless the authorization is terminated  or revoked sooner.       Influenza A by PCR NEGATIVE NEGATIVE Final   Influenza B by PCR NEGATIVE NEGATIVE Final    Comment: (NOTE) The Xpert Xpress SARS-CoV-2/FLU/RSV plus assay is intended as an aid in the diagnosis of influenza from Nasopharyngeal swab specimens and should not be used as a sole basis for treatment. Nasal washings and aspirates are unacceptable for Xpert Xpress SARS-CoV-2/FLU/RSV testing.  Fact Sheet for Patients: EntrepreneurPulse.com.au  Fact Sheet for Healthcare  Providers: IncredibleEmployment.be  This test is not yet approved or cleared by the [place] FDA and has been authorized for detection and/or diagnosis of SARS-CoV-2 by FDA under an Emergency Use Authorization (EUA). This EUA will remain in effect (meaning this test can be used) for the duration of the COVID-19 declaration under Section 564(b)(1) of the Act, 21 U.S.C. section 360bbb-3(b)(1), unless the authorization is terminated or revoked.     Resp Syncytial Virus by PCR NEGATIVE NEGATIVE Final    Comment: (NOTE) Fact Sheet for Patients: EntrepreneurPulse.com.au  Fact Sheet for Healthcare Providers: IncredibleEmployment.be  This test is not yet approved or cleared by the [place] FDA and has been authorized for detection and/or diagnosis of SARS-CoV-2 by FDA under an Emergency Use Authorization (EUA). This EUA will remain in effect (meaning this test can be used) for the duration of the COVID-19 declaration under Section 564(b)(1) of the Act, 21 U.S.C. section 360bbb-3(b)(1), unless the authorization is terminated or revoked.  Performed at [facility], [address] [place], [place] [address]   Blood culture (routine single)     Status: None (Preliminary result)   Collection Time: 05/24/22  6:37 AM   Specimen: BLOOD  Result Value Ref Range Status   Specimen Description   Final    BLOOD BLOOD RIGHT ARM Performed at [facility], [address], [place], [place] [address]    Special Requests   Final    BOTTLES DRAWN AEROBIC AND ANAEROBIC Blood Culture adequate volume Performed at [facility], [address], [place], [place] [address]    Culture  Setup Time   Final    GRAM POSITIVE RODS Organism ID to follow AEROBIC BOTTLE ONLY CRITICAL RESULT CALLED TO, READ BACK BY AND VERIFIED WITH: NATHAN BELUE 05/27/2021 AT 0450 SRR CRITICAL RESULT CALLED TO, READ BACK BY AND VERIFIED  WITH: Esperanza Richters [id number] FCP Performed at [facility], [address], [place], [place] [address]    Culture GRAM POSITIVE RODS  Final   Report Status PENDING  Incomplete  Blood Culture ID Panel (Reflexed)     Status: None   Collection Time: 05/24/22  6:37 AM  Result Value Ref Range Status   Enterococcus faecalis NOT DETECTED NOT DETECTED Final   Enterococcus Faecium NOT DETECTED NOT DETECTED Final   Listeria monocytogenes NOT DETECTED NOT DETECTED Final   Staphylococcus species NOT DETECTED NOT DETECTED Final   Staphylococcus aureus (BCID) NOT DETECTED NOT DETECTED Final   Staphylococcus epidermidis NOT DETECTED NOT DETECTED Final   Staphylococcus lugdunensis NOT DETECTED NOT DETECTED Final   Streptococcus species NOT DETECTED NOT DETECTED Final   Streptococcus agalactiae NOT DETECTED NOT DETECTED Final   Streptococcus pneumoniae NOT DETECTED NOT DETECTED Final   Streptococcus pyogenes NOT DETECTED NOT DETECTED Final   A.calcoaceticus-baumannii NOT DETECTED NOT DETECTED Final   Bacteroides fragilis NOT DETECTED NOT DETECTED Final   Enterobacterales NOT DETECTED NOT DETECTED Final   Enterobacter cloacae complex NOT DETECTED NOT DETECTED Final  Escherichia coli NOT DETECTED NOT DETECTED Final   Klebsiella aerogenes NOT DETECTED NOT DETECTED Final   Klebsiella oxytoca NOT DETECTED NOT DETECTED Final   Klebsiella pneumoniae NOT DETECTED NOT DETECTED Final   Proteus species NOT DETECTED NOT DETECTED Final   Salmonella species NOT DETECTED NOT DETECTED Final   Serratia marcescens NOT DETECTED NOT DETECTED Final   Haemophilus influenzae NOT DETECTED NOT DETECTED Final   Neisseria meningitidis NOT DETECTED NOT DETECTED Final   Pseudomonas aeruginosa NOT DETECTED NOT DETECTED Final   Stenotrophomonas maltophilia NOT DETECTED NOT DETECTED Final   Candida albicans NOT DETECTED NOT DETECTED Final   Candida auris NOT DETECTED NOT DETECTED Final   Candida glabrata NOT  DETECTED NOT DETECTED Final   Candida krusei NOT DETECTED NOT DETECTED Final   Candida parapsilosis NOT DETECTED NOT DETECTED Final   Candida tropicalis NOT DETECTED NOT DETECTED Final   Cryptococcus neoformans/gattii NOT DETECTED NOT DETECTED Final    Comment: Performed at [facility], [address], [place], [place] [address]    Studies: No results found.  Scheduled Meds:  allopurinol  100 mg Oral BID   furosemide  20 mg Oral Daily   insulin aspart  0-9 Units Subcutaneous TID WC   isosorbide mononitrate  30 mg Oral Daily   leflunomide  20 mg Oral Daily   losartan  25 mg Oral BID   metoprolol tartrate  25 mg Oral BID   pantoprazole  40 mg Oral Daily   Continuous Infusions:  heparin 1,050 Units/hr (05/27/22 0827)    LOS: 3 days   Time spent: 25 minutes  Etoy Mcdonnell Marry Guan, MD [facility] Pager [phone]  If 7PM-7AM, please contact night-coverage [email or web address] Password [facility] 05/27/2022, 12:15 PM

## 2022-05-28 LAB — ECHOCARDIOGRAM COMPLETE
AR max vel: 1.97 cm2
AV Area VTI: 1.9 cm2
AV Area mean vel: 1.83 cm2
AV Mean grad: 4 mmHg
AV Peak grad: 7 mmHg
Ao pk vel: 1.32 m/s
Area-P 1/2: 2.19 cm2
Calc EF: 32.5 %
Height: 64 in
MV M vel: 5.24 m/s
MV Peak grad: 109.8 mmHg
MV VTI: 1.58 cm2
P 1/2 time: 561 msec
S' Lateral: 4.3 cm
Single Plane A2C EF: 30.6 %
Single Plane A4C EF: 32.9 %
Weight: 2464 oz

## 2022-05-31 LAB — CULTURE, BLOOD (SINGLE): Special Requests: ADEQUATE

## 2024-04-01 ENCOUNTER — Other Ambulatory Visit: Payer: Self-pay | Admitting: Internal Medicine

## 2024-04-01 DIAGNOSIS — I6529 Occlusion and stenosis of unspecified carotid artery: Secondary | ICD-10-CM

## 2024-05-31 ENCOUNTER — Other Ambulatory Visit: Payer: Self-pay | Admitting: Internal Medicine

## 2024-05-31 DIAGNOSIS — I6529 Occlusion and stenosis of unspecified carotid artery: Secondary | ICD-10-CM

## 2024-06-24 ENCOUNTER — Ambulatory Visit: Admission: RE | Admit: 2024-06-24

## 2024-06-24 DIAGNOSIS — I6529 Occlusion and stenosis of unspecified carotid artery: Secondary | ICD-10-CM

## 2024-06-24 LAB — POCT I-STAT CREATININE: Creatinine, Ser: 1.6 mg/dL — ABNORMAL HIGH (ref 0.44–1.00)

## 2024-06-24 MED ORDER — IOHEXOL 350 MG/ML SOLN
50.0000 mL | Freq: Once | INTRAVENOUS | Status: AC | PRN
  Administered 2024-06-24: 50 mL via INTRAVENOUS
